# Patient Record
Sex: Female | Born: 1955 | Race: White | Hispanic: No | Marital: Single
Health system: Midwestern US, Community
[De-identification: ages and names within clinical notes are randomized; demographics above are authoritative.]

## PROBLEM LIST (undated history)

## (undated) DIAGNOSIS — J8410 Pulmonary fibrosis, unspecified: Principal | ICD-10-CM

## (undated) DIAGNOSIS — R109 Unspecified abdominal pain: Secondary | ICD-10-CM

## (undated) DIAGNOSIS — R8290 Unspecified abnormal findings in urine: Secondary | ICD-10-CM

## (undated) DIAGNOSIS — K7460 Unspecified cirrhosis of liver: Principal | ICD-10-CM

## (undated) DIAGNOSIS — J441 Chronic obstructive pulmonary disease with (acute) exacerbation: Principal | ICD-10-CM

## (undated) DIAGNOSIS — E782 Mixed hyperlipidemia: Secondary | ICD-10-CM

## (undated) DIAGNOSIS — F331 Major depressive disorder, recurrent, moderate: Principal | ICD-10-CM

## (undated) DIAGNOSIS — F5101 Primary insomnia: Secondary | ICD-10-CM

## (undated) DIAGNOSIS — J45901 Unspecified asthma with (acute) exacerbation: Secondary | ICD-10-CM

## (undated) DIAGNOSIS — J849 Interstitial pulmonary disease, unspecified: Secondary | ICD-10-CM

## (undated) DIAGNOSIS — Z0000 Encounter for general adult medical examination without abnormal findings: Secondary | ICD-10-CM

## (undated) DIAGNOSIS — R252 Cramp and spasm: Secondary | ICD-10-CM

## (undated) DIAGNOSIS — K219 Gastro-esophageal reflux disease without esophagitis: Secondary | ICD-10-CM

## (undated) DIAGNOSIS — M1990 Unspecified osteoarthritis, unspecified site: Secondary | ICD-10-CM

## (undated) DIAGNOSIS — J449 Chronic obstructive pulmonary disease, unspecified: Secondary | ICD-10-CM

## (undated) DIAGNOSIS — J45909 Unspecified asthma, uncomplicated: Secondary | ICD-10-CM

## (undated) DIAGNOSIS — I10 Essential (primary) hypertension: Secondary | ICD-10-CM

## (undated) DIAGNOSIS — E079 Disorder of thyroid, unspecified: Secondary | ICD-10-CM

## (undated) DIAGNOSIS — I509 Heart failure, unspecified: Secondary | ICD-10-CM

## (undated) HISTORY — DX: Disorder of thyroid, unspecified: E07.9

## (undated) HISTORY — PX: CYST REMOVAL NECK: SHX6281

## (undated) HISTORY — PX: CYST REMOVAL HAND: SHX6279

## (undated) HISTORY — PX: ECTOPIC PREGNANCY SURGERY: SHX613

## (undated) HISTORY — DX: Unspecified osteoarthritis, unspecified site: M19.90

## (undated) HISTORY — PX: KNEE ARTHROSCOPY: SUR90

## (undated) HISTORY — PX: OTHER SURGICAL HISTORY: SHX169

## (undated) HISTORY — DX: Gastro-esophageal reflux disease without esophagitis: K21.9

## (undated) HISTORY — PX: HAND SURGERY: SHX662

## (undated) HISTORY — PX: OVARIAN CYST SURGERY: SHX726

## (undated) HISTORY — PX: THYROIDECTOMY, PARTIAL: SHX18

## (undated) HISTORY — PX: EYE SURGERY: SHX253

## (undated) HISTORY — DX: Unspecified asthma, uncomplicated: J45.909

---

## 2001-07-16 ENCOUNTER — Encounter: Admission: RE | Admit: 2001-07-16 | Discharge: 2001-10-14 | Payer: Self-pay | Admitting: Occupational Medicine

## 2002-08-10 ENCOUNTER — Encounter: Payer: Self-pay | Admitting: Occupational Medicine

## 2002-08-10 ENCOUNTER — Encounter: Admission: RE | Admit: 2002-08-10 | Discharge: 2002-08-10 | Payer: Self-pay | Admitting: Occupational Medicine

## 2004-06-13 ENCOUNTER — Ambulatory Visit (HOSPITAL_COMMUNITY): Admission: RE | Admit: 2004-06-13 | Discharge: 2004-06-13 | Payer: Self-pay | Admitting: Internal Medicine

## 2005-04-24 ENCOUNTER — Ambulatory Visit (HOSPITAL_COMMUNITY): Payer: Self-pay | Admitting: Psychiatry

## 2005-05-09 ENCOUNTER — Ambulatory Visit (HOSPITAL_COMMUNITY): Payer: Self-pay | Admitting: Psychiatry

## 2005-05-14 ENCOUNTER — Ambulatory Visit (HOSPITAL_COMMUNITY): Admission: RE | Admit: 2005-05-14 | Discharge: 2005-05-14 | Payer: Self-pay | Admitting: Orthopedic Surgery

## 2005-05-22 ENCOUNTER — Ambulatory Visit (HOSPITAL_COMMUNITY): Payer: Self-pay | Admitting: Psychiatry

## 2005-06-05 ENCOUNTER — Ambulatory Visit (HOSPITAL_COMMUNITY): Payer: Self-pay | Admitting: Psychiatry

## 2005-06-21 ENCOUNTER — Ambulatory Visit (HOSPITAL_COMMUNITY): Payer: Self-pay | Admitting: Licensed Clinical Social Worker

## 2005-06-29 ENCOUNTER — Ambulatory Visit (HOSPITAL_COMMUNITY): Admission: RE | Admit: 2005-06-29 | Discharge: 2005-06-29 | Payer: Self-pay | Admitting: Neurosurgery

## 2005-07-24 ENCOUNTER — Ambulatory Visit (HOSPITAL_COMMUNITY): Payer: Self-pay | Admitting: Physician Assistant

## 2005-07-26 ENCOUNTER — Ambulatory Visit (HOSPITAL_COMMUNITY): Payer: Self-pay | Admitting: Licensed Clinical Social Worker

## 2005-08-01 ENCOUNTER — Ambulatory Visit (HOSPITAL_COMMUNITY): Payer: Self-pay | Admitting: Licensed Clinical Social Worker

## 2005-08-13 ENCOUNTER — Ambulatory Visit (HOSPITAL_COMMUNITY): Admission: RE | Admit: 2005-08-13 | Discharge: 2005-08-13 | Payer: Self-pay | Admitting: Family Medicine

## 2005-08-13 ENCOUNTER — Ambulatory Visit (HOSPITAL_COMMUNITY): Payer: Self-pay | Admitting: Licensed Clinical Social Worker

## 2005-08-22 ENCOUNTER — Other Ambulatory Visit: Admission: RE | Admit: 2005-08-22 | Discharge: 2005-08-22 | Payer: Self-pay | Admitting: Interventional Radiology

## 2005-08-22 ENCOUNTER — Encounter (INDEPENDENT_AMBULATORY_CARE_PROVIDER_SITE_OTHER): Payer: Self-pay | Admitting: Specialist

## 2005-08-22 ENCOUNTER — Encounter: Admission: RE | Admit: 2005-08-22 | Discharge: 2005-08-22 | Payer: Self-pay | Admitting: Family Medicine

## 2005-08-23 ENCOUNTER — Ambulatory Visit (HOSPITAL_COMMUNITY): Payer: Self-pay | Admitting: Psychiatry

## 2005-09-10 ENCOUNTER — Ambulatory Visit (HOSPITAL_COMMUNITY): Payer: Self-pay | Admitting: Licensed Clinical Social Worker

## 2005-09-17 ENCOUNTER — Ambulatory Visit (HOSPITAL_COMMUNITY): Payer: Self-pay | Admitting: Licensed Clinical Social Worker

## 2005-09-19 ENCOUNTER — Ambulatory Visit (HOSPITAL_COMMUNITY): Admission: RE | Admit: 2005-09-19 | Discharge: 2005-09-20 | Payer: Self-pay | Admitting: Surgery

## 2005-09-19 ENCOUNTER — Encounter (INDEPENDENT_AMBULATORY_CARE_PROVIDER_SITE_OTHER): Payer: Self-pay | Admitting: *Deleted

## 2005-09-24 ENCOUNTER — Ambulatory Visit (HOSPITAL_COMMUNITY): Payer: Self-pay | Admitting: Licensed Clinical Social Worker

## 2005-10-01 ENCOUNTER — Ambulatory Visit (HOSPITAL_COMMUNITY): Payer: Self-pay | Admitting: Licensed Clinical Social Worker

## 2005-10-11 ENCOUNTER — Ambulatory Visit (HOSPITAL_COMMUNITY): Payer: Self-pay | Admitting: Licensed Clinical Social Worker

## 2005-10-15 ENCOUNTER — Ambulatory Visit (HOSPITAL_COMMUNITY): Payer: Self-pay | Admitting: Psychiatry

## 2005-10-16 ENCOUNTER — Ambulatory Visit (HOSPITAL_COMMUNITY): Payer: Self-pay | Admitting: Licensed Clinical Social Worker

## 2005-10-23 ENCOUNTER — Ambulatory Visit (HOSPITAL_COMMUNITY): Payer: Self-pay | Admitting: Licensed Clinical Social Worker

## 2009-01-28 ENCOUNTER — Encounter: Admission: RE | Admit: 2009-01-28 | Discharge: 2009-01-28 | Payer: Self-pay | Admitting: Family Medicine

## 2010-07-30 ENCOUNTER — Encounter: Payer: Self-pay | Admitting: Family Medicine

## 2010-10-24 ENCOUNTER — Encounter (HOSPITAL_BASED_OUTPATIENT_CLINIC_OR_DEPARTMENT_OTHER)
Admission: RE | Admit: 2010-10-24 | Discharge: 2010-10-24 | Disposition: A | Discharge status: Home / Self Care | Source: Ambulatory Visit | Attending: Orthopedic Surgery | Admitting: Orthopedic Surgery

## 2010-10-24 LAB — BASIC METABOLIC PANEL
BUN: 12 mg/dL (ref 6–23)
CO2: 25 mEq/L (ref 19–32)
Calcium: 9.6 mg/dL (ref 8.4–10.5)
Chloride: 100 mEq/L (ref 96–112)
Creatinine, Ser: 0.89 mg/dL (ref 0.4–1.2)
GFR calc Af Amer: >60 mL/min (ref >60)
GFR calc non Af Amer: >60 mL/min (ref >60)
Glucose, Bld: 104 mg/dL — ABNORMAL HIGH (ref 70–99)
Sodium: 133 mEq/L — ABNORMAL LOW (ref 135–145)

## 2010-10-25 ENCOUNTER — Ambulatory Visit (HOSPITAL_BASED_OUTPATIENT_CLINIC_OR_DEPARTMENT_OTHER)
Admission: RE | Admit: 2010-10-25 | Discharge: 2010-10-25 | Disposition: A | Discharge status: Home / Self Care | Source: Ambulatory Visit | Attending: Orthopedic Surgery | Admitting: Orthopedic Surgery

## 2010-10-25 DIAGNOSIS — Z01812 Encounter for preprocedural laboratory examination: Secondary | ICD-10-CM | POA: Insufficient documentation

## 2010-10-25 DIAGNOSIS — I10 Essential (primary) hypertension: Secondary | ICD-10-CM | POA: Insufficient documentation

## 2010-10-25 DIAGNOSIS — G5620 Lesion of ulnar nerve, unspecified upper limb: Secondary | ICD-10-CM | POA: Insufficient documentation

## 2010-10-25 DIAGNOSIS — F411 Generalized anxiety disorder: Secondary | ICD-10-CM | POA: Insufficient documentation

## 2010-10-25 DIAGNOSIS — K219 Gastro-esophageal reflux disease without esophagitis: Secondary | ICD-10-CM | POA: Insufficient documentation

## 2010-10-25 DIAGNOSIS — E785 Hyperlipidemia, unspecified: Secondary | ICD-10-CM | POA: Insufficient documentation

## 2010-10-25 DIAGNOSIS — J4489 Other specified chronic obstructive pulmonary disease: Secondary | ICD-10-CM | POA: Insufficient documentation

## 2010-10-25 DIAGNOSIS — J449 Chronic obstructive pulmonary disease, unspecified: Secondary | ICD-10-CM | POA: Insufficient documentation

## 2010-10-25 DIAGNOSIS — F17200 Nicotine dependence, unspecified, uncomplicated: Secondary | ICD-10-CM | POA: Insufficient documentation

## 2010-10-25 DIAGNOSIS — M129 Arthropathy, unspecified: Secondary | ICD-10-CM | POA: Insufficient documentation

## 2010-10-25 DIAGNOSIS — G5600 Carpal tunnel syndrome, unspecified upper limb: Secondary | ICD-10-CM | POA: Insufficient documentation

## 2010-10-25 LAB — POCT HEMOGLOBIN-HEMACUE: Hemoglobin: 14.3 g/dL (ref 12.0–15.0)

## 2010-10-31 NOTE — Op Note (Signed)
Dawn Day, Dawn Day              ACCOUNT NO.:  [id number]  MEDICAL RECORD NO.:  [id number]          PATIENT TYPE:  LOCATION:                                 FACILITY:  PHYSICIAN:  Harvie Junior, M.D.        DATE OF BIRTH:  DATE OF PROCEDURE: DATE OF DISCHARGE:                              OPERATIVE REPORT   PREOPERATIVE DIAGNOSES:  Cubital tunnel syndrome, left and carpal tunnel syndrome, left.  POSTOPERATIVE DIAGNOSES:  Cubital tunnel syndrome, left and carpal tunnel syndrome, left.  PROCEDURE: 1. Left cubital tunnel release. 2. Left carpal tunnel release.  SURGEON:  Harvie Junior, MD  ASSISTANT:  Marshia Ly, PA.  BRIEF HISTORY:  Dawn Day is a 55 year old female with a long history of significant numbness and tingling in all of her fingers.  She was having difficulty holding things.  She was clumsy with the hand because of continued complaints of numbness and tingling and pain.  She ultimately underwent EMG examination which showed she had both carpal tunnel syndrome and cubital tunnel syndrome.  I talked to her about treatment options and at this point felt that the most reliable course of action for her was going to be decompression of the ulnar nerve at the elbow and the median nerve at the wrist.  She was brought to the operating room for this procedure.  PROCEDURE:  The patient was brought to the operating.  After adequate anesthesia was obtained with general anesthetic, the patient was supine on the operating table.  The left arm was prepped and draped in usual sterile fashion.  Following this, the arm was exsanguinated and blood pressure tourniquet inflated to 300 mmHg and following this, attention was turned towards the left arm.  After landmarks were identified, the arm had been prepped and draped and exsanguinated and blood pressure tourniquet was applied.  A small incision was made just ulnar to the midline wrist crease.  Subcutaneous tissue down  to the level of the volar carpal ligament was clearly identified and slightly divided and then a scissor was used to divide the ligament both proximally and distally.  The nerve was then identified and freed up and the once this had been released, a gloved finger was placed in the wound proximally and distally.  The wound was irrigated and closed with interrupted and running nylon suture.  Attention was then turned to the elbow where a curved incision was made over the ulnar nerve.  Subcutaneous tissue down all of the sheath over the ulnar nerve was carefully identified and then opened.  The ulnar nerve was then freed up to 10 cm above the elbow to 10 cm below the elbow and freed up at the elbow crease.  The arm was put through a range of motion.  There was no tendency towards subluxation. At this point, the wound was copiously and thoroughly irrigated.  The final check was made in the nerve to make sure it was completely freed up and in fact it was.  At this point, the wound was closed in layers.  Sterile compressive dressing was applied as well as a  posterior splint and the patient was taken to recovery room and noted to be in satisfactory condition.  Estimated blood loss for this procedure was none.     Harvie Junior, M.D.     Ranae Plumber  D:  10/25/2010  T:  10/25/2010  Job:  [id number]  Electronically Signed by Jodi Geralds M.D. on 10/31/2010 08:35:54 PM

## 2010-11-24 NOTE — Op Note (Signed)
Dawn Day, Dawn Day              ACCOUNT NO.:  [id number]   MEDICAL RECORD NO.:  [id number]          PATIENT TYPE:  OIB   LOCATION:  1008                         FACILITY:  [facility]   PHYSICIAN:  Thornton Park. Daphine Deutscher, MD  DATE OF BIRTH:  04/08/1956   DATE OF PROCEDURE:  09/19/2005  DATE OF DISCHARGE:                                 OPERATIVE REPORT   PREOPERATIVE DIAGNOSIS:  Right thyroid nodule.   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   PROCEDURE:  Right thyroid lobectomy.   SURGEON:  Dr. Luretha Murphy   ASSISTANT:  Dr. Dominga Ferry.   ANESTHESIA:  General endotracheal.   DESCRIPTION OF PROCEDURE:  Ms. Bracewell was taken to room 12 on September 19, 2005 and given general.  She was placed in the sitting position.  Her neck  was prepped with chlorhexidine and draped sterilely.  A transverse incision  was made 2 fingerbreadths above the sternal notch, roughly from  sternocleidomastoid to sternocleidomastoid until the platysma muscles were  divided, and then flaps were raised superiorly and inferiorly, and Mahorner  retractor was inserted.  The midline was identified and divided, and then  the right thyroid lobe was separated from the strap muscles.  Work brought  first the superior pole which was isolated, and these were tied with 2-0  silk ties and with clips to free the superior pole.  Inferiorly, the thyroid  was mobilized, and the middle thyroid vein was ligated.  I then stayed right  on the thyroid gland and freed it from its surroundings and teased it away  from the inferior thyroid artery.  In addition, I identified the recurrent  laryngeal nerve as it entered and stayed up on the gland away from that.  The last part of the dissection was with a sharp knife, taking it off the  region of the Berry's ligament.  Specimen was sent for frozen section with  the above-mentioned diagnosis.  In the meantime, we put in  some Surgicel.  I  inspected it for bleeding, and none was seen.  The wound was then closed in  the midline with interrupted 4-0 Vicryl and then the platysma subcutaneous  layer was with 4-0 Vicryl, and then I closed with a running subcuticular 5-0  Vicryl with Benzoin and Steri-Strips.  The patient seemed to tolerate the  procedure well and was taken to the recovery room in satisfactory condition.      Thornton Park Daphine Deutscher, MD  Electronically Signed    MBM/MEDQ  D:  09/19/2005  T:  09/20/2005  Job:  [id number]   cc:   Lesly Rubenstein, MD  [place]

## 2012-08-24 ENCOUNTER — Emergency Department (HOSPITAL_COMMUNITY)
Admission: EM | Admit: 2012-08-24 | Discharge: 2012-08-24 | Disposition: A | Discharge status: Home / Self Care | Attending: Emergency Medicine | Admitting: Emergency Medicine

## 2012-08-24 ENCOUNTER — Encounter (HOSPITAL_COMMUNITY): Payer: Self-pay

## 2012-08-24 DIAGNOSIS — I509 Heart failure, unspecified: Secondary | ICD-10-CM | POA: Insufficient documentation

## 2012-08-24 DIAGNOSIS — R42 Dizziness and giddiness: Secondary | ICD-10-CM | POA: Insufficient documentation

## 2012-08-24 DIAGNOSIS — R609 Edema, unspecified: Secondary | ICD-10-CM

## 2012-08-24 DIAGNOSIS — F17200 Nicotine dependence, unspecified, uncomplicated: Secondary | ICD-10-CM | POA: Insufficient documentation

## 2012-08-24 DIAGNOSIS — Z79899 Other long term (current) drug therapy: Secondary | ICD-10-CM | POA: Insufficient documentation

## 2012-08-24 DIAGNOSIS — I10 Essential (primary) hypertension: Secondary | ICD-10-CM | POA: Insufficient documentation

## 2012-08-24 DIAGNOSIS — J449 Chronic obstructive pulmonary disease, unspecified: Secondary | ICD-10-CM | POA: Insufficient documentation

## 2012-08-24 DIAGNOSIS — R0602 Shortness of breath: Secondary | ICD-10-CM | POA: Insufficient documentation

## 2012-08-24 DIAGNOSIS — M25569 Pain in unspecified knee: Secondary | ICD-10-CM | POA: Insufficient documentation

## 2012-08-24 DIAGNOSIS — M7989 Other specified soft tissue disorders: Secondary | ICD-10-CM

## 2012-08-24 DIAGNOSIS — J4489 Other specified chronic obstructive pulmonary disease: Secondary | ICD-10-CM | POA: Insufficient documentation

## 2012-08-24 HISTORY — DX: Chronic obstructive pulmonary disease, unspecified: J44.9

## 2012-08-24 HISTORY — DX: Heart failure, unspecified: I50.9

## 2012-08-24 HISTORY — DX: Essential (primary) hypertension: I10

## 2012-08-24 MED ORDER — ENOXAPARIN SODIUM 80 MG/0.8ML ~~LOC~~ SOLN
1.0000 mg/kg | Freq: Once | SUBCUTANEOUS | Status: AC
  Administered 2012-08-24: 65 mg via SUBCUTANEOUS
  Filled 2012-08-24: qty 0.8

## 2012-08-24 NOTE — ED Provider Notes (Addendum)
History  This chart was scribed for Donnetta Hutching, MD by Erskine Emery, ED Scribe. This patient was seen in room APA08/APA08 and the patient's care was started at 15:08.   CSN: [id number]  Arrival date & time 08/24/12  1406   First MD Initiated Contact with Patient 08/24/12 1508      Chief Complaint  Patient presents with  . Leg Swelling    (Consider location/radiation/quality/duration/timing/severity/associated sxs/prior treatment) The history is provided by the patient. No language interpreter was used.   Dawn Day is a 57 y.o. female who presents to the Emergency Department complaining of right leg swelling for the past 3 days. Pt reports some associated pain behind the right knee upon bending it at the knee, dizziness, and SOB. Pt reports she can only walk across a room before getting winded, which is baseline. Pt has a h/o congestive heart failure and COPD but no h/o blood clots in her legs. Pt saw her PCP, Dr. Loney Hering, for a similar complaint on Friday (3 days ago). He prescribed her potassium pills. She is also taking aspirin but no blood thinners. Pt was driven to the hospital by her father.  Past Medical History  Diagnosis Date  . COPD (chronic obstructive pulmonary disease)   . CHF (congestive heart failure)   . Hypertension     Past Surgical History  Procedure Laterality Date  . Abdominal hysterectomy    . Ectopic pregnancy surgery      No family history on file.  History  Substance Use Topics  . Smoking status: Current Every Day Smoker  . Smokeless tobacco: Not on file  . Alcohol Use: No    OB History   Grav Para Term Preterm Abortions TAB SAB Ect Mult Living                  Review of Systems  Respiratory: Positive for shortness of breath.   Musculoskeletal:       Right knee pain and right leg swelling.  Neurological: Positive for dizziness.  All other systems reviewed and are negative.    Allergies  Review of patient's allergies indicates not  on file.  Home Medications  No current outpatient prescriptions on file.  Triage Vitals: BP 131/80  Pulse 91  Temp(Src) 97.4 F (36.3 C) (Oral)  Resp 22  Ht 5\' 3"  (1.6 m)  Wt 146 lb (66.225 kg)  BMI 25.87 kg/m2  SpO2 96%  Physical Exam  Nursing note and vitals reviewed. Constitutional: She is oriented to person, place, and time. She appears well-developed and well-nourished.  HENT:  Head: Normocephalic and atraumatic.  Eyes: Conjunctivae and EOM are normal. Pupils are equal, round, and reactive to light.  Neck: Normal range of motion. Neck supple.  Cardiovascular: Normal rate, regular rhythm and normal heart sounds.   Pulmonary/Chest: Effort normal and breath sounds normal.  Abdominal: Soft. Bowel sounds are normal.  Musculoskeletal: Normal range of motion. She exhibits tenderness.  Right leg: tender in popliteal area. Right foot is tender.  Neurological: She is alert and oriented to person, place, and time.  Skin: Skin is warm and dry.  Psychiatric: She has a normal mood and affect.    ED Course  Procedures (including critical care time) DIAGNOSTIC STUDIES: Oxygen Saturation is 96% on room air, adequate by my interpretation.    COORDINATION OF CARE: 15:25--I evaluated the patient and we discussed a treatment plan including blood thinning medicaiton injection and follow up here tomorrow for evaluation of blood clot to  which the pt agreed.    Labs Reviewed - No data to display No results found.   No diagnosis found.    MDM  Doppler study right lower extremity scheduled for Monday morning. Lovenox 1 mg per kilogram subcutaneous given prior to discharge. Discussed potential diagnosis with patient.  No clinical evidence of pulmonary embolus.    I personally performed the services described in this documentation, which was scribed in my presence. The recorded information has been reviewed and is accurate.    Donnetta Hutching, MD 08/24/12 1627  Donnetta Hutching,  MD 08/24/12 [phone]

## 2012-08-24 NOTE — ED Notes (Signed)
Complain of feet and legs swelling

## 2012-08-25 ENCOUNTER — Ambulatory Visit (HOSPITAL_COMMUNITY): Admit: 2012-08-25

## 2013-01-29 DIAGNOSIS — I10 Essential (primary) hypertension: Secondary | ICD-10-CM | POA: Insufficient documentation

## 2014-06-21 LAB — HM HEPATITIS C SCREENING LAB: HM Hepatitis Screen: NEGATIVE

## 2014-06-24 ENCOUNTER — Ambulatory Visit: Payer: Self-pay | Admitting: Family Medicine

## 2015-12-02 LAB — HM PAP SMEAR: HM Pap smear: NEGATIVE

## 2015-12-21 ENCOUNTER — Other Ambulatory Visit: Payer: Self-pay | Admitting: Family Medicine

## 2015-12-21 DIAGNOSIS — Z1231 Encounter for screening mammogram for malignant neoplasm of breast: Secondary | ICD-10-CM

## 2016-01-11 ENCOUNTER — Ambulatory Visit
Admission: RE | Admit: 2016-01-11 | Discharge: 2016-01-11 | Disposition: A | Discharge status: Home / Self Care | Payer: BLUE CROSS/BLUE SHIELD | Source: Ambulatory Visit | Attending: Family Medicine | Admitting: Family Medicine

## 2016-01-11 ENCOUNTER — Other Ambulatory Visit: Payer: Self-pay | Admitting: Family Medicine

## 2016-01-11 DIAGNOSIS — Z1231 Encounter for screening mammogram for malignant neoplasm of breast: Secondary | ICD-10-CM

## 2016-01-11 DIAGNOSIS — R928 Other abnormal and inconclusive findings on diagnostic imaging of breast: Secondary | ICD-10-CM | POA: Diagnosis not present

## 2016-01-19 ENCOUNTER — Other Ambulatory Visit: Payer: Self-pay | Admitting: Family Medicine

## 2016-01-19 DIAGNOSIS — R928 Other abnormal and inconclusive findings on diagnostic imaging of breast: Secondary | ICD-10-CM

## 2016-01-30 ENCOUNTER — Ambulatory Visit
Admission: RE | Admit: 2016-01-30 | Discharge: 2016-01-30 | Disposition: A | Discharge status: Home / Self Care | Payer: BLUE CROSS/BLUE SHIELD | Source: Ambulatory Visit | Attending: Family Medicine | Admitting: Family Medicine

## 2016-01-30 DIAGNOSIS — R928 Other abnormal and inconclusive findings on diagnostic imaging of breast: Secondary | ICD-10-CM

## 2017-01-29 ENCOUNTER — Other Ambulatory Visit: Payer: Self-pay | Admitting: Family Medicine

## 2017-01-29 DIAGNOSIS — Z1231 Encounter for screening mammogram for malignant neoplasm of breast: Secondary | ICD-10-CM

## 2017-01-29 DIAGNOSIS — E782 Mixed hyperlipidemia: Secondary | ICD-10-CM | POA: Insufficient documentation

## 2017-02-12 ENCOUNTER — Ambulatory Visit
Admission: RE | Admit: 2017-02-12 | Discharge: 2017-02-12 | Disposition: A | Discharge status: Home / Self Care | Payer: BLUE CROSS/BLUE SHIELD | Source: Ambulatory Visit | Attending: Family Medicine | Admitting: Family Medicine

## 2017-02-12 DIAGNOSIS — Z1231 Encounter for screening mammogram for malignant neoplasm of breast: Secondary | ICD-10-CM | POA: Insufficient documentation

## 2017-08-06 LAB — HM HIV SCREENING LAB: HM HIV Screening: NEGATIVE

## 2018-02-04 ENCOUNTER — Other Ambulatory Visit: Payer: Self-pay | Admitting: Family Medicine

## 2018-02-04 DIAGNOSIS — Z1231 Encounter for screening mammogram for malignant neoplasm of breast: Secondary | ICD-10-CM

## 2018-02-21 ENCOUNTER — Ambulatory Visit
Admission: RE | Admit: 2018-02-21 | Discharge: 2018-02-21 | Disposition: A | Discharge status: Home / Self Care | Payer: BLUE CROSS/BLUE SHIELD | Source: Ambulatory Visit | Attending: Family Medicine | Admitting: Family Medicine

## 2018-02-21 DIAGNOSIS — Z1231 Encounter for screening mammogram for malignant neoplasm of breast: Secondary | ICD-10-CM | POA: Insufficient documentation

## 2018-03-04 DIAGNOSIS — H9319 Tinnitus, unspecified ear: Secondary | ICD-10-CM | POA: Insufficient documentation

## 2018-03-04 DIAGNOSIS — L723 Sebaceous cyst: Secondary | ICD-10-CM | POA: Insufficient documentation

## 2018-03-04 DIAGNOSIS — I8393 Asymptomatic varicose veins of bilateral lower extremities: Secondary | ICD-10-CM | POA: Insufficient documentation

## 2018-03-04 LAB — LIPID PANEL
Cholesterol: 162 (ref 0–200)
HDL: 59 (ref 35–70)
LDL Cholesterol: 80
Triglycerides: 114 (ref 40–160)

## 2018-03-04 LAB — BASIC METABOLIC PANEL
BUN: 18 (ref 4–21)
Creatinine: 1.1 (ref 0.5–1.1)
Glucose: 89
Potassium: 4.4 (ref 3.4–5.3)
Sodium: 135 — AB (ref 137–147)

## 2018-03-25 ENCOUNTER — Encounter (INDEPENDENT_AMBULATORY_CARE_PROVIDER_SITE_OTHER): Payer: Self-pay | Admitting: Vascular Surgery

## 2019-07-30 ENCOUNTER — Encounter: Payer: Self-pay | Admitting: Family Medicine

## 2019-07-30 ENCOUNTER — Ambulatory Visit (INDEPENDENT_AMBULATORY_CARE_PROVIDER_SITE_OTHER): Admitting: Family Medicine

## 2019-07-30 ENCOUNTER — Other Ambulatory Visit: Payer: Self-pay

## 2019-07-30 VITALS — BP 170/82 | HR 81 | Temp 98.1°F | Ht 62.0 in | Wt 147.8 lb

## 2019-07-30 DIAGNOSIS — F17200 Nicotine dependence, unspecified, uncomplicated: Secondary | ICD-10-CM

## 2019-07-30 DIAGNOSIS — J410 Simple chronic bronchitis: Secondary | ICD-10-CM

## 2019-07-30 DIAGNOSIS — Z1231 Encounter for screening mammogram for malignant neoplasm of breast: Secondary | ICD-10-CM

## 2019-07-30 DIAGNOSIS — I10 Essential (primary) hypertension: Secondary | ICD-10-CM

## 2019-07-30 DIAGNOSIS — E782 Mixed hyperlipidemia: Secondary | ICD-10-CM

## 2019-07-30 DIAGNOSIS — E039 Hypothyroidism, unspecified: Secondary | ICD-10-CM | POA: Diagnosis not present

## 2019-07-30 DIAGNOSIS — L299 Pruritus, unspecified: Secondary | ICD-10-CM

## 2019-07-30 LAB — CBC
HCT: 43.6 % (ref 36.0–46.0)
Hemoglobin: 15 g/dL (ref 12.0–15.0)
MCHC: 34.4 g/dL (ref 30.0–36.0)
MCV: 93.6 fl (ref 78.0–100.0)
Platelets: 156 10*3/uL (ref 150.0–400.0)
RBC: 4.66 Mil/uL (ref 3.87–5.11)
RDW: 13.2 % (ref 11.5–15.5)
WBC: 6.8 10*3/uL (ref 4.0–10.5)

## 2019-07-30 LAB — COMPREHENSIVE METABOLIC PANEL
ALT: 16 U/L (ref 0–35)
AST: 20 U/L (ref 0–37)
Albumin: 4.3 g/dL (ref 3.5–5.2)
Alkaline Phosphatase: 77 U/L (ref 39–117)
BUN: 10 mg/dL (ref 6–23)
CO2: 28 mEq/L (ref 19–32)
Calcium: 9.8 mg/dL (ref 8.4–10.5)
Chloride: 103 mEq/L (ref 96–112)
Creatinine, Ser: 0.87 mg/dL (ref 0.40–1.20)
GFR: 65.55 mL/min (ref >60.00)
Glucose, Bld: 97 mg/dL (ref 70–99)
Potassium: 4 mEq/L (ref 3.5–5.1)
Sodium: 137 mEq/L (ref 135–145)
Total Bilirubin: 0.7 mg/dL (ref 0.2–1.2)
Total Protein: 7.1 g/dL (ref 6.0–8.3)

## 2019-07-30 LAB — LIPID PANEL
Cholesterol: 157 mg/dL (ref 0–200)
HDL: 54.4 mg/dL (ref >39.00)
LDL Cholesterol: 82 mg/dL (ref 0–99)
NonHDL: 102.82
Total CHOL/HDL Ratio: 3
Triglycerides: 102 mg/dL (ref 0.0–149.0)
VLDL: 20.4 mg/dL (ref 0.0–40.0)

## 2019-07-30 LAB — TSH: TSH: 0.25 u[IU]/mL — ABNORMAL LOW (ref 0.35–4.50)

## 2019-07-30 MED ORDER — NICOTINE 21 MG/24HR TD PT24: 21.0000 mg | MEDICATED_PATCH | Freq: Every day | TRANSDERMAL | 1 refills | Status: DC

## 2019-07-30 MED ORDER — LEVOTHYROXINE SODIUM 88 MCG PO TABS: 88.0000 ug | ORAL_TABLET | Freq: Every day | ORAL | 3 refills | Status: DC

## 2019-07-30 MED ORDER — NICOTINE 7 MG/24HR TD PT24: 7.0000 mg | MEDICATED_PATCH | Freq: Every day | TRANSDERMAL | 0 refills | Status: AC

## 2019-07-30 MED ORDER — HYDROXYZINE HCL 25 MG PO TABS: 25.0000 mg | ORAL_TABLET | Freq: Three times a day (TID) | ORAL | 3 refills | Status: DC | PRN

## 2019-07-30 MED ORDER — ATORVASTATIN CALCIUM 10 MG PO TABS: 10.0000 mg | ORAL_TABLET | Freq: Every day | ORAL | 3 refills | Status: DC

## 2019-07-30 MED ORDER — NICOTINE 14 MG/24HR TD PT24: 14.0000 mg | MEDICATED_PATCH | Freq: Every day | TRANSDERMAL | 0 refills | Status: DC

## 2019-07-30 NOTE — Addendum Note (Signed)
Addended by: Lesleigh Noe on: 07/30/2019 05:54 PM   Modules accepted: Orders

## 2019-07-30 NOTE — Progress Notes (Signed)
Subjective:     Dawn Day is a 64 y.o. female presenting for Establish Care (previous PCP was Dr Salome Holmes), Medication Refill, and Leg Problem (wakes up at night with ache sensation in her legs)     HPI  #Asthma/COPD - occasional bad cough - still smoking - takes medication for this  - using advair daily - using rescue inhaler - using advair BID, singular - using albuterol every night and occasionally during the day  #tobacco use - has always just done a few cig/day - has tried in the past, but around people who do smoke - has had some friends try chantix and she is weary  #Leg issues - aching yesterday AM - wake her up at night with aching symptoms - feels like a muscle - not sure what it is - worse at night - no tingling or numbness - Treatment: OTC restless leg medication w/o improvement - intermittent whether they improve with walking around  Review of Systems   Social History   Tobacco Use  Smoking Status Current Every Day Smoker  . Packs/day: 0.25  . Years: 30.00  . Pack years: 7.50  . Types: Cigarettes  Smokeless Tobacco Never Used        Objective:    BP Readings from Last 3 Encounters:  07/30/19 (!) 170/82  08/24/12 98/57   Wt Readings from Last 3 Encounters:  07/30/19 147 lb 12 oz (67 kg)  08/24/12 146 lb (66.2 kg)    BP (!) 170/82   Pulse 81   Temp 98.1 F (36.7 C)   Ht 5\' 2"  (1.575 m)   Wt 147 lb 12 oz (67 kg)   SpO2 92%   BMI 27.02 kg/m    Physical Exam Constitutional:      General: She is not in acute distress.    Appearance: She is well-developed. She is not diaphoretic.  HENT:     Right Ear: External ear normal.     Left Ear: External ear normal.     Nose: Nose normal.  Eyes:     Conjunctiva/sclera: Conjunctivae normal.  Cardiovascular:     Rate and Rhythm: Normal rate and regular rhythm.     Heart sounds: No murmur.  Pulmonary:     Effort: Pulmonary effort is normal. No respiratory distress.   Breath sounds: Normal breath sounds. No wheezing.  Musculoskeletal:     Cervical back: Neck supple.     Comments: Finger clubbing  Skin:    General: Skin is warm and dry.     Capillary Refill: Capillary refill takes less than 2 seconds.  Neurological:     Mental Status: She is alert. Mental status is at baseline.  Psychiatric:        Mood and Affect: Mood normal.        Behavior: Behavior normal.           Assessment & Plan:   Problem List Items Addressed This Visit      Cardiovascular and Mediastinum   Essential hypertension - Primary    Not currently on medication. Encouraged smoking cessation. F/u 1 month for re-check. Will likely start medication if still elevated.       Relevant Medications   simvastatin (ZOCOR) 10 MG tablet   Other Relevant Orders   Comprehensive metabolic panel   CBC     Respiratory   COPD (chronic obstructive pulmonary disease) (HCC)    Pt using albuterol regularly, though overall clear lungs. Will reassess in  1 month as she is hoping to quit smoking. If still having frequent albuterol use may need to add spiriva (LAMA) to help with symptoms.       Relevant Medications   montelukast (SINGULAIR) 10 MG tablet   Fluticasone-Salmeterol (ADVAIR) 250-50 MCG/DOSE AEPB   albuterol (VENTOLIN HFA) 108 (90 Base) MCG/ACT inhaler   nicotine (NICODERM CQ - DOSED IN MG/24 HOURS) 21 mg/24hr patch   nicotine (NICODERM CQ - DOSED IN MG/24 HOURS) 14 mg/24hr patch   nicotine (NICODERM CQ - DOSED IN MG/24 HR) 7 mg/24hr patch     Endocrine   Hypothyroid   Relevant Medications   levothyroxine (SYNTHROID) 100 MCG tablet   Other Relevant Orders   TSH     Other   Hyperlipidemia, mixed   Relevant Medications   simvastatin (ZOCOR) 10 MG tablet   Other Relevant Orders   Lipid panel   Tobacco use disorder    Pt interested in quitting. Will start with nicotine replacement. Sent to pharmacy      Relevant Medications   nicotine (NICODERM CQ - DOSED IN MG/24  HOURS) 21 mg/24hr patch   nicotine (NICODERM CQ - DOSED IN MG/24 HOURS) 14 mg/24hr patch   nicotine (NICODERM CQ - DOSED IN MG/24 HR) 7 mg/24hr patch   Chronic pruritus    Controlled on hydroxyzine. Refill provided      Relevant Medications   hydrOXYzine (ATARAX/VISTARIL) 25 MG tablet       Return in about 4 weeks (around 08/27/2019) for for COPD and smoking.  Lesleigh Noe, MD

## 2019-07-30 NOTE — Assessment & Plan Note (Signed)
Controlled on hydroxyzine. Refill provided

## 2019-07-30 NOTE — Assessment & Plan Note (Signed)
Pt using albuterol regularly, though overall clear lungs. Will reassess in 1 month as she is hoping to quit smoking. If still having frequent albuterol use may need to add spiriva (LAMA) to help with symptoms.

## 2019-07-30 NOTE — Assessment & Plan Note (Signed)
Not currently on medication. Encouraged smoking cessation. F/u 1 month for re-check. Will likely start medication if still elevated.

## 2019-07-30 NOTE — Patient Instructions (Addendum)
Apply one 21-mg patch transdermally daily for weeks 1 through 6, then one 14-mg patch daily for weeks 7 and 8, and then one 7-mg patch daily for weeks 9 and 10     Leg Cramps Leg cramps occur when one or more muscles tighten and you have no control over this tightening (involuntary muscle contraction). Muscle cramps can develop in any muscle, but the most common place is in the calf muscles of the leg. Those cramps can occur during exercise or when you are at rest. Leg cramps are painful, and they may last for a few seconds to a few minutes. Cramps may return several times before they finally stop. Usually, leg cramps are not caused by a serious medical problem. In many cases, the cause is not known. Some common causes include:  Excessive physical effort (overexertion), such as during intense exercise.  Overuse from repetitive motions, or doing the same thing over and over.  Staying in a certain position for a long period of time.  Improper preparation, form, or technique while performing a sport or an activity.  Dehydration.  Injury.  Side effects of certain medicines.  Abnormally low levels of minerals in your blood (electrolytes), especially potassium and calcium. This could result from: ? Pregnancy. ? Taking diuretic medicines. Follow these instructions at home: Eating and drinking  Drink enough fluid to keep your urine pale yellow. Staying hydrated may help prevent cramps.  Eat a healthy diet that includes plenty of nutrients to help your muscles function. A healthy diet includes fruits and vegetables, lean protein, whole grains, and low-fat or nonfat dairy products. Managing pain, stiffness, and swelling      Try massaging, stretching, and relaxing the affected muscle. Do this for several minutes at a time.  If directed, put ice on areas that are sore or painful after a cramp: ? Put ice in a plastic bag. ? Place a towel between your skin and the bag. ? Leave the ice  on for 20 minutes, 2-3 times a day.  If directed, apply heat to muscles that are tense or tight. Do this before you exercise, or as often as told by your health care provider. Use the heat source that your health care provider recommends, such as a moist heat pack or a heating pad. ? Place a towel between your skin and the heat source. ? Leave the heat on for 20-30 minutes. ? Remove the heat if your skin turns bright red. This is especially important if you are unable to feel pain, heat, or cold. You may have a greater risk of getting burned.  Try taking hot showers or baths to help relax tight muscles. General instructions  If you are having frequent leg cramps, avoid intense exercise for several days.  Take over-the-counter and prescription medicines only as told by your health care provider.  Keep all follow-up visits as told by your health care provider. This is important. Contact a health care provider if:  Your leg cramps get more severe or more frequent, or they do not improve over time.  Your foot becomes cold, numb, or blue. Summary  Muscle cramps can develop in any muscle, but the most common place is in the calf muscles of the leg.  Leg cramps are painful, and they may last for a few seconds to a few minutes.  Usually, leg cramps are not caused by a serious medical problem. Often, the cause is not known.  Stay hydrated and take over-the-counter and prescription  medicines only as told by your health care provider. This information is not intended to replace advice given to you by your health care provider. Make sure you discuss any questions you have with your health care provider. Document Revised: 06/07/2017 Document Reviewed: 04/04/2017 Elsevier Patient Education  2020 [employer].

## 2019-07-30 NOTE — Assessment & Plan Note (Signed)
Pt interested in quitting. Will start with nicotine replacement. Sent to pharmacy

## 2019-08-18 ENCOUNTER — Encounter: Payer: Self-pay | Admitting: Family Medicine

## 2019-08-18 ENCOUNTER — Ambulatory Visit (INDEPENDENT_AMBULATORY_CARE_PROVIDER_SITE_OTHER): Payer: 59 | Admitting: Family Medicine

## 2019-08-18 ENCOUNTER — Other Ambulatory Visit: Payer: Self-pay

## 2019-08-18 ENCOUNTER — Telehealth: Payer: Self-pay

## 2019-08-18 VITALS — BP 168/104 | HR 73 | Temp 98.3°F | Resp 18 | Ht 62.0 in | Wt 148.0 lb

## 2019-08-18 DIAGNOSIS — R42 Dizziness and giddiness: Secondary | ICD-10-CM | POA: Diagnosis not present

## 2019-08-18 DIAGNOSIS — I10 Essential (primary) hypertension: Secondary | ICD-10-CM | POA: Diagnosis not present

## 2019-08-18 DIAGNOSIS — F17200 Nicotine dependence, unspecified, uncomplicated: Secondary | ICD-10-CM

## 2019-08-18 DIAGNOSIS — F5104 Psychophysiologic insomnia: Secondary | ICD-10-CM

## 2019-08-18 DIAGNOSIS — L299 Pruritus, unspecified: Secondary | ICD-10-CM

## 2019-08-18 DIAGNOSIS — G4700 Insomnia, unspecified: Secondary | ICD-10-CM | POA: Insufficient documentation

## 2019-08-18 MED ORDER — HYDROXYZINE HCL 25 MG PO TABS: 25.0000 mg | ORAL_TABLET | Freq: Three times a day (TID) | ORAL | 3 refills | Status: DC | PRN

## 2019-08-18 MED ORDER — LISINOPRIL 20 MG PO TABS: ORAL_TABLET | ORAL | 1 refills | Status: DC

## 2019-08-18 NOTE — Progress Notes (Signed)
Subjective:     Dawn Day is a 64 y.o. female presenting for Hypertension and Dizziness (off and on since LOV)     HPI  #HTN #Dizziness - felt bad yesterday - will get lightheaded - vision will get fuzzy - no cp - no palpitations - dizziness will occur at rest - not associated with position changes - no cough or breathing difficulty - no weakness or sensation changes - BP has been high at home, was on medication in the past but > 10 years   #Worry - Former partner just diagnosed with stage 4 liver cancer - tries to relax - is not sleeping - has had trouble sleeping for a long time, but it has been worse with stress - hx of depression - tried medication and didn't like the way it made her feel - has tried trazodone in the past - did not feel this helped   #tobacco cession - going well - on nicotine replacement  Review of Systems  Constitutional: Negative for chills and fever.     Social History   Tobacco Use  Smoking Status Current Every Day Smoker  . Packs/day: 0.25  . Years: 30.00  . Pack years: 7.50  . Types: Cigarettes  Smokeless Tobacco Never Used        Objective:    BP Readings from Last 3 Encounters:  08/18/19 (!) 168/104  07/30/19 (!) 170/82  08/24/12 98/57   Wt Readings from Last 3 Encounters:  08/18/19 148 lb (67.1 kg)  07/30/19 147 lb 12 oz (67 kg)  08/24/12 146 lb (66.2 kg)    BP (!) 168/104   Pulse 73   Temp 98.3 F (36.8 C)   Resp 18   Ht 5\' 2"  (1.575 m)   Wt 148 lb (67.1 kg)   SpO2 96%   BMI 27.07 kg/m    Physical Exam Constitutional:      General: She is not in acute distress.    Appearance: She is well-developed. She is not diaphoretic.  HENT:     Right Ear: External ear normal.     Left Ear: External ear normal.     Nose: Nose normal.  Eyes:     Conjunctiva/sclera: Conjunctivae normal.  Cardiovascular:     Rate and Rhythm: Normal rate and regular rhythm.     Heart sounds: No murmur.  Pulmonary:    Effort: Pulmonary effort is normal. No respiratory distress.     Breath sounds: Normal breath sounds. No wheezing.  Musculoskeletal:     Cervical back: Neck supple.     Right lower leg: No edema.     Left lower leg: No edema.  Skin:    General: Skin is warm and dry.     Capillary Refill: Capillary refill takes less than 2 seconds.  Neurological:     Mental Status: She is alert. Mental status is at baseline.  Psychiatric:        Mood and Affect: Mood normal.        Behavior: Behavior normal.      EKG: NSR, no ST changes, no T wave abnormalities     Assessment & Plan:   Problem List Items Addressed This Visit      Cardiovascular and Mediastinum   Essential hypertension - Primary    BP elevated. Suspect this is the cause of the dizziness. EKG reassuring. Start lisinopril. Return 1 week. If dizziness not improving will do further work-up      Relevant Medications  lisinopril (ZESTRIL) 20 MG tablet     Other   Tobacco use disorder    Working on quitting. Encouraged cessation      Chronic pruritus    Refill hydroxyzine for prn use      Relevant Medications   hydrOXYzine (ATARAX/VISTARIL) 25 MG tablet   Lightheadedness    Suspect 2/2 to HTN. Will treat and if not improving will do labs at next visit. Encouraged hydration      Relevant Orders   EKG 12-Lead (Completed)   Insomnia    Pt notes failure of trazodone in the past. Worse in setting of likely adjustment disorder due to former partner's cancer diagnosis. Melatonin and sleep hygiene. Anticipate prn ambien if no improvement          Return in about 1 week (around 08/25/2019).  Lesleigh Noe, MD

## 2019-08-18 NOTE — Assessment & Plan Note (Signed)
BP elevated. Suspect this is the cause of the dizziness. EKG reassuring. Start lisinopril. Return 1 week. If dizziness not improving will do further work-up

## 2019-08-18 NOTE — Patient Instructions (Signed)
#  Dizziness - Make sure you are drinking plenty of water - Start lisinopril  - EKG was reassuring - return in 1 week for blood pressure check  #Sleep - start taking Melatonin 3-5 mg at night 1-2 hours before bedtime.  - read through information below and see if you can make any changes that may help with sleep - return in 1 week  Sleep hygiene checklist: 1. Avoid naps during the day 2. Avoid stimulants such as caffeine and nicotine. Avoid bedtime alcohol (it can speed onset of sleep but the body's metabolism can cause awakenings). At least 2 hours before bedtime 3. All forms of exercise help ensure sound sleep - limit vigorous exercise to morning or late afternoon 4. Avoid food too close to bedtime including chocolate (which contains caffeine) 5. Soak up natural light 6. Establish regular bedtime routine. 7. Associate bed with sleep - avoid TV, computer or phone, reading while in bed. 8. Ensure pleasant, relaxing sleep environment - quiet, dark, cool room.  Good Sleep Hygiene Habits -- Got to bed and wake up within an hour of the same time every day -- Avoid bright screens (from laptop, phone, TV) within at least 30 minutes before bed. The "blue light" supresses the sleep hormone melatonin and the content may stimulate as well -- Maintain a quiet and dark sleep environment (blackout curtains, turn on a fan or white noise to block out disruptive sounds) -- Practicing relaxing activites before bed (taking a shower, reading a book, journaling, meditation app) -- To quiet a busy mind -- consider journaling before bed (jotting down reminders, worry thoughts, as well as positive things like a gratitude list)   Begin a Mindfulness/Meditation practice -- this can take a little as 3 minutes -- You can find resources in books -- Or you can download apps like  ---- Headspace App (which currently has free content called "Weathering the Storm") ---- Calm (which has a few free options)  ----  Insignt Timer ---- Stop, Breathe & Think  # With each of these Apps - you should decline the "start free trial" offer and as you search through the App should be able to access some of their free content. You can also chose to pay for the content if you find one that works well for you.   # Many of them also offer sleep specific content which may help with insomnia

## 2019-08-18 NOTE — Telephone Encounter (Signed)
Pt last seen 07/30/19; pt was to cb if BP was elevated. Pt said last night BP 140 something /99 and pt had lightheadedness on and off yesterday;advised pt  To stand up slowly. o lightheadedness today. Pt has been under a lot of stress. Now BP 139/94 P 73. Pt already has appt on 08/27/19 but pt does not want to wait for that appt due to elevated BP.no covid symptoms except has usual chronic cough; pt has asthma and had same cough when seen 07/30/19. Pt said is working on stop smoking but has not stopped yet. Left 08/27/19 appt incase pt needs to return for FU. If pt does not need to keep that appt pt will cancel on her way out of office. FYI to Dr Einar Pheasant.

## 2019-08-18 NOTE — Assessment & Plan Note (Signed)
Suspect 2/2 to HTN. Will treat and if not improving will do labs at next visit. Encouraged hydration

## 2019-08-18 NOTE — Assessment & Plan Note (Signed)
Refill hydroxyzine for prn use

## 2019-08-18 NOTE — Telephone Encounter (Signed)
See note from today

## 2019-08-18 NOTE — Assessment & Plan Note (Signed)
Pt notes failure of trazodone in the past. Worse in setting of likely adjustment disorder due to former partner's cancer diagnosis. Melatonin and sleep hygiene. Anticipate prn ambien if no improvement

## 2019-08-18 NOTE — Assessment & Plan Note (Signed)
Working on quitting. Encouraged cessation

## 2019-08-26 ENCOUNTER — Other Ambulatory Visit: Payer: Self-pay

## 2019-08-26 ENCOUNTER — Ambulatory Visit (INDEPENDENT_AMBULATORY_CARE_PROVIDER_SITE_OTHER): Payer: 59 | Admitting: Family Medicine

## 2019-08-26 VITALS — BP 128/82 | HR 64 | Temp 97.9°F | Resp 18 | Ht 62.0 in | Wt 150.5 lb

## 2019-08-26 DIAGNOSIS — J42 Unspecified chronic bronchitis: Secondary | ICD-10-CM

## 2019-08-26 DIAGNOSIS — F17200 Nicotine dependence, unspecified, uncomplicated: Secondary | ICD-10-CM

## 2019-08-26 DIAGNOSIS — I10 Essential (primary) hypertension: Secondary | ICD-10-CM | POA: Diagnosis not present

## 2019-08-26 DIAGNOSIS — D171 Benign lipomatous neoplasm of skin and subcutaneous tissue of trunk: Secondary | ICD-10-CM | POA: Insufficient documentation

## 2019-08-26 MED ORDER — LOSARTAN POTASSIUM 50 MG PO TABS: 50.0000 mg | ORAL_TABLET | Freq: Every day | ORAL | 3 refills | Status: DC

## 2019-08-26 MED ORDER — TIOTROPIUM BROMIDE MONOHYDRATE 18 MCG IN CAPS: 18.0000 ug | ORAL_CAPSULE | Freq: Every day | RESPIRATORY_TRACT | 12 refills | Status: DC

## 2019-08-26 NOTE — Patient Instructions (Signed)
#   High Blood Pressure - Stop the Lisinopril - Start Losartan 50 mg - Check blood pressure at home - If >140/90, call the clinic and we can plan to increase medication  #COPD - start Spiriva - Continue Advair - Return in 3-4 weeks  When you come back in 3-4 weeks -- Labs for dizziness and thyroid -- Breathing check

## 2019-08-26 NOTE — Assessment & Plan Note (Signed)
BP improved on lisinopril but with dry cough. Will switch to losartan.

## 2019-08-26 NOTE — Progress Notes (Signed)
Subjective:     Dawn Day is a 64 y.o. female presenting for Hypertension (follow up) and Dizziness (follow up)     HPI   #HTN - bp improved - still getting some lightheadedness for brief moments while standing - has noticed more coughing - running good at home  #COPD - working on quitting smoking - down to less than 1/2 ppd  - has noticed worsening cough - when she doesn't smoke her BP does  - has noticed a dry cough - using the advair - using albuterol 2-3 times a day   Review of Systems  Respiratory: Negative for shortness of breath.   Cardiovascular: Negative for chest pain.    08/18/2019: Clinic - HTN and lightheadedness - Lisinopril for HTN.   Social History   Tobacco Use  Smoking Status Current Every Day Smoker  . Packs/day: 0.25  . Years: 30.00  . Pack years: 7.50  . Types: Cigarettes  Smokeless Tobacco Never Used        Objective:    BP Readings from Last 3 Encounters:  08/26/19 128/82  08/18/19 (!) 168/104  07/30/19 (!) 170/82   Wt Readings from Last 3 Encounters:  08/26/19 150 lb 8 oz (68.3 kg)  08/18/19 148 lb (67.1 kg)  07/30/19 147 lb 12 oz (67 kg)    BP 128/82   Pulse 64   Temp 97.9 F (36.6 C)   Resp 18   Ht 5\' 2"  (1.575 m)   Wt 150 lb 8 oz (68.3 kg)   SpO2 94%   BMI 27.53 kg/m    Physical Exam Constitutional:      General: She is not in acute distress.    Appearance: She is well-developed. She is not diaphoretic.  HENT:     Right Ear: External ear normal.     Left Ear: External ear normal.     Nose: Nose normal.  Eyes:     Conjunctiva/sclera: Conjunctivae normal.  Cardiovascular:     Rate and Rhythm: Normal rate and regular rhythm.     Heart sounds: No murmur.  Pulmonary:     Effort: Pulmonary effort is normal. No respiratory distress.     Breath sounds: Wheezing present. No rales.  Musculoskeletal:     Cervical back: Neck supple.  Skin:    General: Skin is warm and dry.     Capillary Refill: Capillary  refill takes less than 2 seconds.     Comments: Egg sized lipoma on the left shoulder blade area  Neurological:     Mental Status: She is alert. Mental status is at baseline.  Psychiatric:        Mood and Affect: Mood normal.        Behavior: Behavior normal.           Assessment & Plan:   Problem List Items Addressed This Visit      Cardiovascular and Mediastinum   Essential hypertension    BP improved on lisinopril but with dry cough. Will switch to losartan.       Relevant Medications   losartan (COZAAR) 50 MG tablet     Respiratory   COPD (chronic obstructive pulmonary disease) (HCC) - Primary    Still using albuterol regularly and lungs with diffuse wheezes and generally poor air movement. Will add spiriva. If not improvement at f/u will refer to pulmonology      Relevant Medications   tiotropium (SPIRIVA) 18 MCG inhalation capsule     Other  Tobacco use disorder    Encouraged her continued efforts to cut back.       Lipoma of back   Relevant Orders   Ambulatory referral to General Surgery       Return in about 4 weeks (around 09/23/2019).  Lesleigh Noe, MD

## 2019-08-26 NOTE — Assessment & Plan Note (Signed)
Still using albuterol regularly and lungs with diffuse wheezes and generally poor air movement. Will add spiriva. If not improvement at f/u will refer to pulmonology

## 2019-08-26 NOTE — Assessment & Plan Note (Signed)
Encouraged her continued efforts to cut back.

## 2019-08-27 ENCOUNTER — Ambulatory Visit: Admitting: Family Medicine

## 2019-09-04 ENCOUNTER — Ambulatory Visit: Payer: Self-pay | Admitting: Surgery

## 2019-09-07 ENCOUNTER — Ambulatory Visit (INDEPENDENT_AMBULATORY_CARE_PROVIDER_SITE_OTHER): Payer: 59 | Admitting: Surgery

## 2019-09-07 ENCOUNTER — Other Ambulatory Visit: Payer: Self-pay

## 2019-09-07 ENCOUNTER — Encounter: Payer: Self-pay | Admitting: Surgery

## 2019-09-07 VITALS — BP 110/72 | HR 96 | Temp 97.5°F | Resp 12 | Ht 62.0 in | Wt 149.0 lb

## 2019-09-07 DIAGNOSIS — D171 Benign lipomatous neoplasm of skin and subcutaneous tissue of trunk: Secondary | ICD-10-CM | POA: Diagnosis not present

## 2019-09-07 NOTE — Patient Instructions (Addendum)
As discussed with Dr Hampton Abbot please talk to your daughter in regards to when you want to schedule your appointment to have your lipoma removed here at the office. As stated, Dr Mont Dutton office days are Mondays afternoon, Wednesday mornings and Fridays mornings. Once speaking to your daughter, please contact the office to have the procedure scheduled.  Please call the office if you have any questions or concerns.  Lipoma Removal  Lipoma removal is a surgical procedure to remove a lipoma, which is a noncancerous (benign) tumor that is made up of fat cells. Most lipomas are small and painless and do not require treatment. They can form in many areas of the body but are most common under the skin of the back, arms, shoulders, buttocks, and thighs. You may need lipoma removal if you have a lipoma that is large, growing, or causing discomfort. Lipoma removal may also be done for cosmetic reasons. Tell a health care provider about:  Any allergies you have.  All medicines you are taking, including vitamins, herbs, eye drops, creams, and over-the-counter medicines.  Any problems you or family members have had with anesthetic medicines.  Any blood disorders you have.  Any surgeries you have had.  Any medical conditions you have.  Whether you are pregnant or may be pregnant. What are the risks? Generally, this is a safe procedure. However, problems may occur, including:  Infection.  Bleeding.  Scarring.  Allergic reactions to medicines.  Damage to nearby structures or organs, such as damage to nerves or blood vessels near the lipoma. What happens before the procedure? Staying hydrated Follow instructions from your health care provider about hydration, which may include:  Up to 2 hours before the procedure - you may continue to drink clear liquids, such as water, clear fruit juice, black coffee, and plain tea. Eating and drinking restrictions Follow instructions from your health care  provider about eating and drinking, which may include:  8 hours before the procedure - stop eating heavy meals or foods, such as meat, fried foods, or fatty foods.  6 hours before the procedure - stop eating light meals or foods, such as toast or cereal.  6 hours before the procedure - stop drinking milk or drinks that contain milk.  2 hours before the procedure - stop drinking clear liquids. Medicines Ask your health care provider about:  Changing or stopping your regular medicines. This is especially important if you are taking diabetes medicines or blood thinners.  Taking medicines such as aspirin and ibuprofen. These medicines can thin your blood. Do not take these medicines unless your health care provider tells you to take them.  Taking over-the-counter medicines, vitamins, herbs, and supplements. General instructions  You will have a physical exam. Your health care provider will check the size of the lipoma and whether it can be moved easily.  You may have a biopsy and imaging tests, such as X-rays, a CT scan, and an MRI.  Do not use any products that contain nicotine or tobacco for at least 4 weeks before the procedure. These products include cigarettes, e-cigarettes, and chewing tobacco. If you need help quitting, ask your health care provider.  Ask your health care provider: ? How your surgery site will be marked. ? What steps will be taken to help prevent infection. These may include:  Washing skin with a germ-killing soap.  Taking antibiotic medicine.  Plan to have someone take you home from the hospital or clinic.  If you will be going home  right after the procedure, plan to have someone with you for 24 hours. What happens during the procedure?   An IV will be inserted into one of your veins.  You will be given one or more of the following: ? A medicine to help you relax (sedative). ? A medicine to numb the area (local anesthetic). ? A medicine to make you  fall asleep (general anesthetic). ? A medicine that is injected into an area of your body to numb everything below the injection site (regional anesthetic).  An incision will be made over the lipoma or very near the lipoma. The incision may be made in a natural skin line or crease.  Tissues, nerves, and blood vessels near the lipoma will be moved out of the way.  The lipoma and the capsule that surrounds it will be separated from the surrounding tissues.  The lipoma will be removed.  The incision may be closed with stitches (sutures).  A bandage (dressing) will be placed over the incision. The procedure may vary among health care providers and hospitals. What happens after the procedure?  Your blood pressure, heart rate, breathing rate, and blood oxygen level will be monitored until you leave the hospital or clinic.  If you were prescribed an antibiotic medicine, use it as told by your health care provider. Do not stop using the antibiotic even if you start to feel better.  If you were given a sedative during the procedure, it can affect you for several hours. Do not drive or operate machinery until your health care provider says that it is safe. Summary  Before the procedure, follow instructions from your health care provider about eating and drinking, and changing or stopping your regular medicines. This is especially important if you are taking diabetes medicines or blood thinners.  After the lipoma is removed, the incision may be closed with stitches (sutures) and covered with a bandage (dressing).  If you were given a sedative during the procedure, it can affect you for several hours. Do not drive or operate machinery until your health care provider says that it is safe. This information is not intended to replace advice given to you by your health care provider. Make sure you discuss any questions you have with your health care provider. Document Revised: 02/09/2019 Document  Reviewed: 02/09/2019 Elsevier Patient Education  [place].

## 2019-09-08 ENCOUNTER — Encounter: Payer: Self-pay | Admitting: Surgery

## 2019-09-08 NOTE — Progress Notes (Signed)
09/08/2019  Reason for Visit:  Back lipoma  History of Present Illness: Dawn Day is a 64 y.o. female presenting for evaluation of a left upper back lipoma.  She reports she has had this for many years.  It started very small and has grown in size.  She denies any pain from the mass, but she can feel it when she lies down or sits back.  She does have a history of prior excision of skin lesions, and does not want this mass to get worse.  Denies any drainage, pain, erythema from that area.  Past Medical History: Past Medical History:  Diagnosis Date  . Arthritis   . Asthma   . COPD (chronic obstructive pulmonary disease) ([place])   . GERD (gastroesophageal reflux disease)   . Hypertension   . Thyroid disease      Past Surgical History: Past Surgical History:  Procedure Laterality Date  . Arm surgery Left    for pinched nerve  . CYST REMOVAL HAND Right   . CYST REMOVAL NECK Left   . 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 HAND SURGERY Bilateral    to re route nerves for hand weakness  . KNEE ARTHROSCOPY Right   . OVARIAN CYST SURGERY     removed cyst and tube-not sure of the side  . THYROIDECTOMY, PARTIAL Left     Home Medications: Prior to Admission medications   Medication Sig Start Date End Date Taking? Authorizing Provider  albuterol (VENTOLIN HFA) 108 (90 Base) MCG/ACT inhaler Inhale 1 puff into the lungs every 6 (six) hours as needed. 06/02/19 06/01/20 Yes [provider]  atorvastatin (LIPITOR) 10 MG tablet Take 1 tablet (10 mg total) by mouth daily. 07/30/19  Yes Lesleigh Noe, MD  Fluticasone-Salmeterol (ADVAIR) 250-50 MCG/DOSE AEPB Inhale 1 puff into the lungs every 12 (twelve) hours. 07/11/19  Yes [provider]  hydrOXYzine (ATARAX/VISTARIL) 25 MG tablet Take 1 tablet (25 mg total) by mouth 3 (three) times daily as needed. For itching 08/18/19 06/08/20 Yes Lesleigh Noe, MD  levothyroxine (SYNTHROID) 88 MCG tablet Take 1 tablet (88 mcg  total) by mouth daily. 07/30/19  Yes Lesleigh Noe, MD  losartan (COZAAR) 50 MG tablet Take 1 tablet (50 mg total) by mouth daily. 08/26/19  Yes Lesleigh Noe, MD  montelukast (SINGULAIR) 10 MG tablet Take 10 mg by mouth daily. 05/25/19  Yes [provider]  nicotine (NICODERM CQ - DOSED IN MG/24 HOURS) 14 mg/24hr patch Place 1 patch (14 mg total) onto the skin daily. 07/30/19  Yes Lesleigh Noe, MD  nicotine (NICODERM CQ - DOSED IN MG/24 HOURS) 21 mg/24hr patch Place 1 patch (21 mg total) onto the skin daily. 07/30/19  Yes Lesleigh Noe, MD  nicotine (NICODERM CQ - DOSED IN MG/24 HR) 7 mg/24hr patch Place 1 patch (7 mg total) onto the skin daily. 07/30/19  Yes Lesleigh Noe, MD  tiotropium (SPIRIVA) 18 MCG inhalation capsule Place 1 capsule (18 mcg total) into inhaler and inhale daily. 08/26/19  Yes Lesleigh Noe, MD    Allergies: Allergies  Allergen Reactions  . Pravastatin Other (See Comments)    Muscle pain    Social History:  reports that she has been smoking cigarettes. She has a 7.50 pack-year smoking history. She has never used smokeless tobacco. She reports current alcohol use. She reports that she does not use drugs.   Family History: Family History  Problem Relation  Age of Onset  . Breast cancer Maternal Aunt   . Alcohol abuse Mother   . Early death Mother        appendix rupture  . Depression Mother   . Stomach cancer Brother   . Alcohol abuse Brother   . Drug abuse Brother     Review of Systems: Review of Systems  Constitutional: Negative for chills and fever.  HENT: Negative for hearing loss.   Respiratory: Negative for shortness of breath.   Cardiovascular: Negative for chest pain.  Gastrointestinal: Negative for abdominal pain, nausea and vomiting.  Genitourinary: Negative for dysuria.  Musculoskeletal: Negative for myalgias.  Skin: Negative for rash.  Neurological: Negative for dizziness.  Psychiatric/Behavioral: Negative for depression.     Physical Exam BP 110/72   Pulse 96   Temp (!) 97.5 F (36.4 C)   Resp 12   Ht 5\' 2"  (1.575 m)   Wt 149 lb (67.6 kg)   SpO2 97%   BMI 27.25 kg/m  CONSTITUTIONAL: No acute distress HEENT:  Normocephalic, atraumatic, extraocular motion intact. NECK: Trachea is midline, and there is no jugular venous distension.  RESPIRATORY:  Lungs are clear, and breath sounds are equal bilaterally. Normal respiratory effort without pathologic use of accessory muscles. CARDIOVASCULAR: Heart is regular without murmurs, gallops, or rubs. GI: The abdomen is soft, non-distended, non-tender.  MUSCULOSKELETAL:  Normal muscle strength and tone in all four extremities.  No peripheral edema or cyanosis. SKIN:  Left upper back has a 5 cm, mobile, soft mass consistent with lipoma.   It is not tender.  There is no overlying skin ulceration.  The mass is overlying the medial aspect of the left scapula. NEUROLOGIC:  Motor and sensation is grossly normal.  Cranial nerves are grossly intact. PSYCH:  Alert and oriented to person, place and time. Affect is normal.  Laboratory Analysis: Labs 07/30/19: WBC 6.8, Hgb 15, Hct 43.6, Plt 156.  Na 137, K 4.0, Cl 103, CO2 28, BUN 10, Cr. 0.87, LFTs within normal.  Imaging: No results found.  Assessment and Plan: This is a 64 y.o. female with a left upper back lipoma.  --Discussed with the patient the potential options for excision to be done in the office or in the OR.  After discussing the pros and cons of each, she has opted for excision in the office.  I think that is very reasonable based on the size and the location.  It is very mobile and non-tender. --The patient will be talking with her family this weekend to discuss when it would be best to do the procedure.  She will call us early next week to schedule her procedure.  Discussed with her that my clinic days are Monday afternoons, and Wednesday and Friday mornings. --Follow up depending on timing for the  procedure.  Face-to-face time spent with the patient and care providers was 60 minutes, with more than 50% of the time spent counseling, educating, and coordinating care of the patient.     Melvyn Neth, [facility]

## 2019-09-21 ENCOUNTER — Ambulatory Visit (INDEPENDENT_AMBULATORY_CARE_PROVIDER_SITE_OTHER): Payer: 59 | Admitting: Family Medicine

## 2019-09-21 ENCOUNTER — Encounter: Payer: Self-pay | Admitting: Family Medicine

## 2019-09-21 ENCOUNTER — Other Ambulatory Visit: Payer: Self-pay

## 2019-09-21 VITALS — BP 124/72 | HR 72 | Temp 98.0°F | Resp 12 | Ht 62.0 in | Wt 151.2 lb

## 2019-09-21 DIAGNOSIS — E038 Other specified hypothyroidism: Secondary | ICD-10-CM | POA: Diagnosis not present

## 2019-09-21 DIAGNOSIS — I10 Essential (primary) hypertension: Secondary | ICD-10-CM

## 2019-09-21 DIAGNOSIS — J449 Chronic obstructive pulmonary disease, unspecified: Secondary | ICD-10-CM

## 2019-09-21 DIAGNOSIS — R1011 Right upper quadrant pain: Secondary | ICD-10-CM | POA: Diagnosis not present

## 2019-09-21 DIAGNOSIS — R1012 Left upper quadrant pain: Secondary | ICD-10-CM

## 2019-09-21 LAB — BASIC METABOLIC PANEL
BUN: 8 mg/dL (ref 6–23)
CO2: 27 mEq/L (ref 19–32)
Calcium: 9.8 mg/dL (ref 8.4–10.5)
Chloride: 99 mEq/L (ref 96–112)
Creatinine, Ser: 0.86 mg/dL (ref 0.40–1.20)
GFR: 66.4 mL/min (ref >60.00)
Glucose, Bld: 101 mg/dL — ABNORMAL HIGH (ref 70–99)
Potassium: 4.2 mEq/L (ref 3.5–5.1)
Sodium: 136 mEq/L (ref 135–145)

## 2019-09-21 LAB — TSH: TSH: 1.73 u[IU]/mL (ref 0.35–4.50)

## 2019-09-21 NOTE — Assessment & Plan Note (Signed)
Dose was increased as over-replaced in January. Repeat TSH today. Cont current dose.

## 2019-09-21 NOTE — Assessment & Plan Note (Signed)
BP well controled on losartan and no longer with cough. Cont losartan. Labs today

## 2019-09-21 NOTE — Assessment & Plan Note (Signed)
Pt notes improvement with needing albuterol less. Still with daily use, but previous was 2-3 times per day. Will continue to monitor and get Pulm if worsening.

## 2019-09-21 NOTE — Assessment & Plan Note (Signed)
Suspect muscle pain. Advised slow stretching to see if that improves.

## 2019-09-21 NOTE — Patient Instructions (Addendum)
#  High blood pressure - continue losartan - labs today  #COPD - if you end up needing more albuterol, let me know - otherwise continue current medications   #Abdominal wall pain - Try to stretch your abdomen - I couldn't find good examples - bend/twist to the side gently - bend back, but don't hurt your back

## 2019-09-21 NOTE — Progress Notes (Signed)
Subjective:     Dawn Day is a 64 y.o. female presenting for Follow-up (on COPD HTN)     HPI  #HTN - doing well - no longer has cough - no longer lightheadedness - bp at goal - no cp  #COPD - breathing better - is on Spiriva - using albuterol 1-2 times per day - still working on quitting smoking - doing better - less than 1/2 ppd  - using nicotine replacement  #hypothyroidism - TSH was high in Jan - taking lower dose  #abdominal pain - will get a "catch" and knot in her abdomen  - will happen with bending over or turning - feels like a muscle - will be either under the right or left breast at the rib - non-radiating - no constipation  Review of Systems  08/26/2019: Clinic - HTN - switched to losartan 2/2 to cough with lisinopril. COPD - add spiriva   Social History   Tobacco Use  Smoking Status Current Every Day Smoker  . Packs/day: 0.25  . Years: 30.00  . Pack years: 7.50  . Types: Cigarettes  Smokeless Tobacco Never Used        Objective:    BP Readings from Last 3 Encounters:  09/21/19 124/72  09/07/19 110/72  08/26/19 128/82   Wt Readings from Last 3 Encounters:  09/21/19 151 lb 4 oz (68.6 kg)  09/07/19 149 lb (67.6 kg)  08/26/19 150 lb 8 oz (68.3 kg)    BP 124/72   Pulse 72   Temp 98 F (36.7 C)   Resp 12   Ht 5\' 2"  (1.575 m)   Wt 151 lb 4 oz (68.6 kg)   SpO2 95%   BMI 27.66 kg/m    Physical Exam Constitutional:      General: She is not in acute distress.    Appearance: She is well-developed. She is not diaphoretic.  HENT:     Right Ear: External ear normal.     Left Ear: External ear normal.     Nose: Nose normal.  Eyes:     Conjunctiva/sclera: Conjunctivae normal.  Cardiovascular:     Rate and Rhythm: Normal rate and regular rhythm.     Heart sounds: No murmur.  Pulmonary:     Effort: Pulmonary effort is normal. No respiratory distress.     Breath sounds: Normal breath sounds. No wheezing.  Abdominal:   General: Abdomen is flat. Bowel sounds are normal. There is no distension.     Palpations: Abdomen is soft.     Tenderness: There is no abdominal tenderness. There is no guarding or rebound.  Musculoskeletal:     Cervical back: Neck supple.  Skin:    General: Skin is warm and dry.     Capillary Refill: Capillary refill takes less than 2 seconds.  Neurological:     Mental Status: She is alert. Mental status is at baseline.  Psychiatric:        Mood and Affect: Mood normal.        Behavior: Behavior normal.           Assessment & Plan:   Problem List Items Addressed This Visit      Cardiovascular and Mediastinum   Essential hypertension - Primary    BP well controled on losartan and no longer with cough. Cont losartan. Labs today      Relevant Orders   Basic metabolic panel     Respiratory   COPD (chronic obstructive pulmonary disease) (  [place])    Pt notes improvement with needing albuterol less. Still with daily use, but previous was 2-3 times per day. Will continue to monitor and get Pulm if worsening.         Endocrine   Hypothyroid    Dose was increased as over-replaced in January. Repeat TSH today. Cont current dose.       Relevant Orders   TSH     Other   Abdominal wall pain in both upper quadrants    Suspect muscle pain. Advised slow stretching to see if that improves.           Return in about 3 months (around 12/22/2019) for annual.  Lesleigh Noe, MD

## 2019-11-04 ENCOUNTER — Other Ambulatory Visit: Payer: Self-pay | Admitting: Family Medicine

## 2019-11-04 DIAGNOSIS — L299 Pruritus, unspecified: Secondary | ICD-10-CM

## 2019-11-04 MED ORDER — HYDROXYZINE HCL 25 MG PO TABS: 25.0000 mg | ORAL_TABLET | Freq: Three times a day (TID) | ORAL | 1 refills | Status: DC | PRN

## 2019-11-04 NOTE — Telephone Encounter (Signed)
I called [facility] and was advised that there were no more refills on file. 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 States her skin is so dry and therefore gives her a lot of itching. This medication helps. Patient states also that her other prescriptions are for 90 days and wonders if this one can be also.  LOV 09/21/19 and next appointment on 12/24/19 for follow up. On 08/18/19 was the last time chronic pruritis was addressed.  Last time Hydroxyzine was filled was on 08/18/19 #30 with 3 refills with directions of 1 tablet 3 times daily as needed. Does direction need to be updated?

## 2019-11-04 NOTE — Telephone Encounter (Signed)
Patient stated she contacted her pharmacy for a refill on her hydrOXYzine  On her bottle it states she still has a refill left but when speaking to the pharmacy they stated she no longer has refills. Patient stated she has been having trouble getting refills from the [facility] so they did not say if they would be sending refill request. Patient would like to know if she still has refills if not can some be sent to the pharmacy for her      She would like the refill sent to [facility]

## 2019-11-23 ENCOUNTER — Ambulatory Visit
Admission: RE | Admit: 2019-11-23 | Discharge: 2019-11-23 | Disposition: A | Discharge status: Home / Self Care | Payer: 59 | Source: Ambulatory Visit | Attending: Family Medicine | Admitting: Family Medicine

## 2019-11-23 DIAGNOSIS — Z1231 Encounter for screening mammogram for malignant neoplasm of breast: Secondary | ICD-10-CM | POA: Insufficient documentation

## 2019-12-24 ENCOUNTER — Other Ambulatory Visit (HOSPITAL_COMMUNITY)
Admission: RE | Admit: 2019-12-24 | Discharge: 2019-12-24 | Disposition: A | Discharge status: Home / Self Care | Payer: 59 | Source: Ambulatory Visit | Attending: Family Medicine | Admitting: Family Medicine

## 2019-12-24 ENCOUNTER — Other Ambulatory Visit: Payer: Self-pay

## 2019-12-24 ENCOUNTER — Ambulatory Visit (INDEPENDENT_AMBULATORY_CARE_PROVIDER_SITE_OTHER): Payer: 59 | Admitting: Family Medicine

## 2019-12-24 ENCOUNTER — Encounter: Payer: Self-pay | Admitting: Family Medicine

## 2019-12-24 VITALS — BP 124/82 | HR 74 | Temp 98.3°F | Ht 62.0 in | Wt 147.0 lb

## 2019-12-24 DIAGNOSIS — Z124 Encounter for screening for malignant neoplasm of cervix: Secondary | ICD-10-CM | POA: Diagnosis present

## 2019-12-24 DIAGNOSIS — Z1211 Encounter for screening for malignant neoplasm of colon: Secondary | ICD-10-CM | POA: Diagnosis not present

## 2019-12-24 DIAGNOSIS — Z0000 Encounter for general adult medical examination without abnormal findings: Secondary | ICD-10-CM | POA: Diagnosis not present

## 2019-12-24 NOTE — Progress Notes (Signed)
Annual Exam   Chief Complaint:  Chief Complaint  Patient presents with  . Annual Exam    History of Present Illness:  Ms. Dawn Day is a 64 y.o. No obstetric history on file. who LMP was No LMP recorded. Patient is postmenopausal., presents today for her annual examination.    #Fall - left shoulder pain - was stepping up to clean the curtains and fell on her shoulder - fall was 1 month ago - is improving  - still with some pain if she puts pressure on that side at night - rolling to that side - pain with lifting  Recent death in the family Daughters father passed away - he was hospitalized for 3 weeks prior to his death by suicide but was diagnosed with liver cancer  Nutrition She does get adequate calcium and Vitamin D in her diet. Diet: baked or boiled, no fried food, only occasional eats out. Decreased appetite over the last month Exercise: not currently  Safety The patient wears seatbelts: yes.     The patient feels safe at home and in their relationships: yes.   Menstrual Post menopausal, still with occasional hot flashes  GYN She is not sexually active.   Cervical Cancer Screening:   Last Pap:   October 2014 Results were: no abnormalities /neg HPV DNA not done  Breast Cancer Screening There is no FH of breast cancer. There is no FH of ovarian cancer. BRCA screening Not Indicated.  Last Mammogram: 11/2019 The patient does want a mammogram this year.    Colon Cancer Screening Age 2-75 yo - benefits outweigh the risk. Adults 28-85 yo who have never been screened benefit.  Benefits: 134000 people in 2016 will be diagnosed and 49,000 will die - early detection helps [place]: Complications 2/2 to colonoscopy High Risk (Colonoscopy): genetic disorder (Lynch syndrome or familial adenomatous polyposis), personal hx of IBD, previous adenomatous polyp, or previous colorectal cancer, FamHx start 10 years before the age at diagnosis, increased in males and black  race  Options:  FIT - looks for hemoglobin (blood in the stool) - specific and fairly sensitive - must be done annually Cologuard - looks for DNA and blood - more sensitive - therefore can have more false positives, every 3 years Colonoscopy - every 10 years if normal - sedation, bowl prep, must have someone drive you  Shared decision making and the patient had decided to do FOBT.   Lung Cancer Screening Annual screening for adults age 52-80 yo with 30 year pack history? No Current Tobacco user? Yes Quit less than 15 years ago? No Interested in low dose CT for lung cancer screening? not applicable  Still working on quitting smoking  Weight Wt Readings from Last 3 Encounters:  12/24/19 147 lb (66.7 kg)  09/21/19 151 lb 4 oz (68.6 kg)  09/07/19 149 lb (67.6 kg)   Patient has high BMI  BMI Readings from Last 1 Encounters:  12/24/19 26.89 kg/m     Chronic disease screening Blood pressure monitoring:  BP Readings from Last 3 Encounters:  12/24/19 124/82  09/21/19 124/72  09/07/19 110/72    Lipid Monitoring: Indication for screening: age >67, obesity, diabetes, family hx, CV risk factors.  Lipid screening: Yes  Lab Results  Component Value Date   CHOL 157 07/30/2019   HDL 54.40 07/30/2019   LDLCALC 82 07/30/2019   TRIG 102.0 07/30/2019   CHOLHDL 3 07/30/2019     Diabetes Screening: age >7, overweight, family hx, PCOS, hx  of gestational diabetes, at risk ethnicity Diabetes Screening screening: Yes  No results found for: HGBA1C   Past Medical History:  Diagnosis Date  . Arthritis   . Asthma   . COPD (chronic obstructive pulmonary disease) ([place])   . GERD (gastroesophageal reflux disease)   . Hypertension   . Thyroid disease     Past Surgical History:  Procedure Laterality Date  . Arm surgery Left    for pinched nerve  . CYST REMOVAL HAND Right   . CYST REMOVAL NECK Left   . 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 HAND SURGERY Bilateral    to re  route nerves for hand weakness  . KNEE ARTHROSCOPY Right   . OVARIAN CYST SURGERY     removed cyst and tube-not sure of the side  . THYROIDECTOMY, PARTIAL Left     Prior to Admission medications   Medication Sig Start Date End Date Taking? Authorizing Provider  albuterol (VENTOLIN HFA) 108 (90 Base) MCG/ACT inhaler Inhale 1 puff into the lungs every 6 (six) hours as needed. 06/02/19 06/01/20 Yes [provider]  atorvastatin (LIPITOR) 10 MG tablet Take 1 tablet (10 mg total) by mouth daily. 07/30/19  Yes Lesleigh Noe, MD  Fluticasone-Salmeterol (ADVAIR) 250-50 MCG/DOSE AEPB Inhale 1 puff into the lungs every 12 (twelve) hours. 07/11/19  Yes [provider]  hydrOXYzine (ATARAX/VISTARIL) 25 MG tablet Take 1 tablet (25 mg total) by mouth 3 (three) times daily as needed. For itching 11/04/19 08/25/20 Yes Lesleigh Noe, MD  levothyroxine (SYNTHROID) 88 MCG tablet Take 1 tablet (88 mcg total) by mouth daily. 07/30/19  Yes Lesleigh Noe, MD  losartan (COZAAR) 50 MG tablet Take 1 tablet (50 mg total) by mouth daily. 08/26/19  Yes Lesleigh Noe, MD  montelukast (SINGULAIR) 10 MG tablet Take 10 mg by mouth daily. 05/25/19  Yes [provider]  nicotine (NICODERM CQ - DOSED IN MG/24 HOURS) 14 mg/24hr patch Place 1 patch (14 mg total) onto the skin daily. 07/30/19  Yes Lesleigh Noe, MD  nicotine (NICODERM CQ - DOSED IN MG/24 HR) 7 mg/24hr patch Place 1 patch (7 mg total) onto the skin daily. 07/30/19  Yes Lesleigh Noe, MD  tiotropium (SPIRIVA) 18 MCG inhalation capsule Place 1 capsule (18 mcg total) into inhaler and inhale daily. 08/26/19  Yes Lesleigh Noe, MD    Allergies  Allergen Reactions  . Pravastatin Other (See Comments)    Muscle pain    Gynecologic History: No LMP recorded. Patient is postmenopausal.  Obstetric History: No obstetric history on file.  Social History   Socioeconomic History  . Marital status: Single    Spouse name: Berneta Sages  . Number  of children: 1  . Years of education: 8th grade  . Highest education level: Not on file  Occupational History  . Not on file  Tobacco Use  . Smoking status: Current Every Day Smoker    Packs/day: 0.25    Years: 30.00    Pack years: 7.50    Types: Cigarettes  . Smokeless tobacco: Never Used  Vaping Use  . Vaping Use: Never used  Substance and Sexual Activity  . Alcohol use: Yes    Comment: wine-2 glasses at night-not every night  . Drug use: Never  . Sexual activity: Not Currently  Other Topics Concern  . Not on file  Social History Narrative   07/30/19   From: from [place], [place]   Living:  with a partner Berneta Sages) x 5 years   Work: stopped working 2 years ago, worked for [employer], hasn't found work recently [employer] a few days a week      Family: Daughter - Micheline Chapman      Enjoys: visit daughter      Exercise: not currently   Diet: avoids fried foods, days she works eating some Corporate treasurer belts: Yes    Guns: No   Safe in relationships: Yes    Social Determinants of Radio broadcast assistant Strain:   . Difficulty of Paying Living Expenses:   Food Insecurity:   . Worried About Charity fundraiser in the Last Year:   . Arboriculturist in the Last Year:   Transportation Needs:   . Film/video editor (Medical):   Marland Kitchen Lack of Transportation (Non-Medical):   Physical Activity:   . Days of Exercise per Week:   . Minutes of Exercise per Session:   Stress:   . Feeling of Stress :   Social Connections:   . Frequency of Communication with Friends and Family:   . Frequency of Social Gatherings with Friends and Family:   . Attends Religious Services:   . Active Member of Clubs or Organizations:   . Attends Archivist Meetings:   Marland Kitchen Marital Status:   Intimate Partner Violence:   . Fear of Current or Ex-Partner:   . Emotionally Abused:   Marland Kitchen Physically Abused:   . Sexually Abused:     Family History  Problem Relation Age of  Onset  . Breast cancer Maternal Aunt   . Alcohol abuse Mother   . Early death Mother        appendix rupture  . Depression Mother   . Stomach cancer Brother   . Alcohol abuse Brother   . Drug abuse Brother     Review of Systems  Constitutional: Negative for chills and fever.  HENT: Negative for congestion and sore throat.   Eyes: Negative for blurred vision and double vision.  Respiratory: Negative for shortness of breath.   Cardiovascular: Negative for chest pain.  Gastrointestinal: Negative for heartburn, nausea and vomiting.  Genitourinary: Negative.   Musculoskeletal: Negative.  Negative for myalgias.  Skin: Negative for rash.  Neurological: Negative for dizziness and headaches.  Endo/Heme/Allergies: Does not bruise/bleed easily.  Psychiatric/Behavioral: Negative for depression. The patient is not nervous/anxious.      Physical Exam BP 124/82   Pulse 74   Temp 98.3 F (36.8 C) (Temporal)   Ht 5' 2"  (1.575 m) Comment: without shoes  Wt 147 lb (66.7 kg)   SpO2 96%   BMI 26.89 kg/m    BP Readings from Last 3 Encounters:  12/24/19 124/82  09/21/19 124/72  09/07/19 110/72      Physical Exam Exam conducted with a chaperone present.  Constitutional:      General: She is not in acute distress.    Appearance: She is well-developed. She is not diaphoretic.  HENT:     Head: Normocephalic and atraumatic.     Right Ear: External ear normal.     Left Ear: External ear normal.     Nose: Nose normal.     Mouth/Throat:     Mouth: Mucous membranes are moist.     Pharynx: No oropharyngeal exudate or posterior oropharyngeal erythema.  Eyes:     General: No scleral icterus.    Conjunctiva/sclera:  Conjunctivae normal.  Cardiovascular:     Rate and Rhythm: Normal rate and regular rhythm.     Heart sounds: No murmur heard.   Pulmonary:     Effort: Pulmonary effort is normal. No respiratory distress.     Breath sounds: Normal breath sounds. No wheezing.  Abdominal:      General: Bowel sounds are normal. There is no distension.     Palpations: Abdomen is soft. There is no mass.     Tenderness: There is no abdominal tenderness. There is no guarding or rebound.  Genitourinary:    Labia:        Right: No rash.        Left: No rash.      Vagina: Normal.     Cervix: Normal.  Musculoskeletal:        General: Normal range of motion.     Cervical back: Neck supple.  Lymphadenopathy:     Cervical: No cervical adenopathy.  Skin:    General: Skin is warm and dry.     Capillary Refill: Capillary refill takes less than 2 seconds.  Neurological:     Mental Status: She is alert and oriented to person, place, and time.     Deep Tendon Reflexes: Reflexes normal.  Psychiatric:        Behavior: Behavior normal.     Results:  PHQ-9:  additional stressors and recent family death. Has good social support. F/u if worsening    Assessment: 64 y.o. No obstetric history on file. female here for routine annual physical examination.  Plan: Problem List Items Addressed This Visit    None    Visit Diagnoses    Annual physical exam    -  Primary   Colon cancer screening       Relevant Orders   Fecal occult blood, imunochemical   Cervical cancer screening       Relevant Orders   Cytology - PAP      Screening: -- Blood pressure screen normal -- cholesterol screening: not due for screening -- Weight screening: overweight: continue to monitor -- Diabetes Screening: not due for screening -- Nutrition: normal - encouraged healthy diet  The 10-year ASCVD risk score Mikey Bussing DC Jr., et al., 2013) is: 10.3%   Values used to calculate the score:     Age: 26 years     Sex: Female     Is Non-Hispanic African American: No     Diabetic: No     Tobacco smoker: Yes     Systolic Blood Pressure: 395 mmHg     Is BP treated: Yes     HDL Cholesterol: 54.4 mg/dL     Total Cholesterol: 157 mg/dL  -- Statin therapy for Age 78-75 with CVD risk >7.5%  Pt on  atorvastatin   Psych -- Depression screening (PHQ-9):  increased stress but managing with family support  Safety -- tobacco screening: encouraged cessation.  -- alcohol screening:  low-risk usage. -- no evidence of domestic violence or intimate partner violence.   Cancer Screening -- pap smear collected per ASCCP guidelines -- family history of breast cancer screening: done. not at high risk. -- Mammogram - pt just had this done and was normal, repeat next year  -- Colon cancer - discussed and plan for FOBT given today  Immunizations -- flu vaccine up to date -- TDAP q10 years up to date  Encouraged regular dental and vision care. Healthy eating and exercise    Lesleigh Noe, MD

## 2019-12-24 NOTE — Patient Instructions (Signed)

## 2019-12-25 LAB — CYTOLOGY - PAP
Comment: NEGATIVE
Diagnosis: NEGATIVE
High risk HPV: NEGATIVE

## 2019-12-29 ENCOUNTER — Other Ambulatory Visit (INDEPENDENT_AMBULATORY_CARE_PROVIDER_SITE_OTHER): Payer: 59

## 2019-12-29 DIAGNOSIS — Z1211 Encounter for screening for malignant neoplasm of colon: Secondary | ICD-10-CM

## 2019-12-29 LAB — FECAL OCCULT BLOOD, IMMUNOCHEMICAL: Fecal Occult Bld: NEGATIVE

## 2020-03-04 ENCOUNTER — Other Ambulatory Visit: Payer: Self-pay | Admitting: Family Medicine

## 2020-03-04 MED ORDER — ALBUTEROL SULFATE HFA 108 (90 BASE) MCG/ACT IN AERS: 1.0000 | INHALATION_SPRAY | Freq: Four times a day (QID) | RESPIRATORY_TRACT | 2 refills | Status: DC | PRN

## 2020-03-04 NOTE — Telephone Encounter (Signed)
Refills sent as requested

## 2020-03-04 NOTE — Telephone Encounter (Signed)
Medication Refill - Medication:  albuterol (VENTOLIN HFA) 108 (90 Base) MCG/ACT inhaler    Has the patient contacted their pharmacy? Yes advised to call as this will be a new script from PCP but previously given by old PCP.   Preferred Pharmacy (with phone number or street name):  [facility], [place] Phone:  [phone]  Fax:  [phone]

## 2020-03-09 ENCOUNTER — Other Ambulatory Visit: Payer: Self-pay

## 2020-03-09 MED ORDER — FLUTICASONE-SALMETEROL 250-50 MCG/DOSE IN AEPB: 1.0000 | INHALATION_SPRAY | Freq: Two times a day (BID) | RESPIRATORY_TRACT | 0 refills | Status: DC

## 2020-03-09 NOTE — Telephone Encounter (Signed)
Pt left v/m requesting status of advair refill; per [facility] [address] the Advair has not previously been prescribed by Dr Einar Pheasant but instead has been filled by Dr Malachi Pro at [facility]. [facility] [address] will send refill request. See 01/24/20 refill request. Pt had annual exam with Dr Einar Pheasant on 12/24/19.

## 2020-04-11 ENCOUNTER — Encounter: Payer: Self-pay | Admitting: Family Medicine

## 2020-04-11 ENCOUNTER — Ambulatory Visit (INDEPENDENT_AMBULATORY_CARE_PROVIDER_SITE_OTHER): Payer: 59 | Admitting: Family Medicine

## 2020-04-11 ENCOUNTER — Other Ambulatory Visit: Payer: Self-pay

## 2020-04-11 VITALS — BP 110/64 | HR 86 | Temp 97.4°F | Ht 62.0 in | Wt 144.2 lb

## 2020-04-11 DIAGNOSIS — Z23 Encounter for immunization: Secondary | ICD-10-CM

## 2020-04-11 DIAGNOSIS — E039 Hypothyroidism, unspecified: Secondary | ICD-10-CM

## 2020-04-11 DIAGNOSIS — F17200 Nicotine dependence, unspecified, uncomplicated: Secondary | ICD-10-CM

## 2020-04-11 DIAGNOSIS — I10 Essential (primary) hypertension: Secondary | ICD-10-CM

## 2020-04-11 DIAGNOSIS — E782 Mixed hyperlipidemia: Secondary | ICD-10-CM

## 2020-04-11 DIAGNOSIS — L299 Pruritus, unspecified: Secondary | ICD-10-CM

## 2020-04-11 DIAGNOSIS — J42 Unspecified chronic bronchitis: Secondary | ICD-10-CM

## 2020-04-11 DIAGNOSIS — E038 Other specified hypothyroidism: Secondary | ICD-10-CM

## 2020-04-11 MED ORDER — NICOTINE 14 MG/24HR TD PT24: 14.0000 mg | MEDICATED_PATCH | Freq: Every day | TRANSDERMAL | 0 refills | Status: AC

## 2020-04-11 MED ORDER — HYDROXYZINE HCL 25 MG PO TABS: 25.0000 mg | ORAL_TABLET | Freq: Three times a day (TID) | ORAL | 1 refills | Status: AC | PRN

## 2020-04-11 MED ORDER — LEVOTHYROXINE SODIUM 88 MCG PO TABS: 88.0000 ug | ORAL_TABLET | Freq: Every day | ORAL | 0 refills | Status: DC

## 2020-04-11 MED ORDER — ATORVASTATIN CALCIUM 10 MG PO TABS: 10.0000 mg | ORAL_TABLET | Freq: Every day | ORAL | 0 refills | Status: DC

## 2020-04-11 MED ORDER — MONTELUKAST SODIUM 10 MG PO TABS
10.0000 mg | ORAL_TABLET | ORAL | 0 refills | Status: DC | PRN
Start: 2020-04-11 — End: 2020-11-16

## 2020-04-11 MED ORDER — LOSARTAN POTASSIUM 50 MG PO TABS: 50.0000 mg | ORAL_TABLET | Freq: Every day | ORAL | 0 refills | Status: AC

## 2020-04-11 MED ORDER — ALBUTEROL SULFATE HFA 108 (90 BASE) MCG/ACT IN AERS: 1.0000 | INHALATION_SPRAY | Freq: Four times a day (QID) | RESPIRATORY_TRACT | 0 refills | Status: AC | PRN

## 2020-04-11 MED ORDER — FLUTICASONE-SALMETEROL 250-50 MCG/DOSE IN AEPB: 1.0000 | INHALATION_SPRAY | Freq: Two times a day (BID) | RESPIRATORY_TRACT | 0 refills | Status: AC

## 2020-04-11 MED ORDER — TIOTROPIUM BROMIDE MONOHYDRATE 18 MCG IN CAPS: 18.0000 ug | ORAL_CAPSULE | Freq: Every day | RESPIRATORY_TRACT | 3 refills | Status: AC

## 2020-04-11 NOTE — Assessment & Plan Note (Signed)
Lab Results  Component Value Date   TSH 1.73 09/21/2019   tsh at goal. Cont levothyroxine

## 2020-04-11 NOTE — Progress Notes (Signed)
Subjective:     Dawn Day is a 64 y.o. 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 Packs/day: 0.25  . Years: 30.00  . Pack years: 7.50  . Types: Cigarettes  Smokeless Tobacco Never Used        Objective:    BP Readings from Last 3 Encounters:  04/11/20 110/64  12/24/19 124/82  09/21/19 124/72   Wt Readings from Last 3 Encounters:  04/11/20 144 lb 4 oz (65.4 kg)  12/24/19 147 lb (66.7 kg)  09/21/19 151 lb 4 oz (68.6 kg)    BP 110/64   Pulse 86   Temp (!) 97.4 F (36.3 C) (Temporal)   Ht 5\' 2"  (1.575 m)   Wt 144 lb 4 oz (65.4 kg)   SpO2 95%   BMI 26.38 kg/m    Physical Exam Constitutional:      General: She is not in acute distress.    Appearance: She is well-developed. She is not diaphoretic.  HENT:     Right Ear: External ear normal.     Left Ear: External ear normal.  Eyes:     Conjunctiva/sclera: Conjunctivae normal.  Cardiovascular:     Rate and Rhythm: Normal rate and regular rhythm.     Heart sounds: No murmur heard.   Pulmonary:     Effort: Pulmonary effort is normal. No respiratory distress.     Breath sounds: Normal breath sounds. No wheezing.  Musculoskeletal:     Cervical back: Neck supple.  Skin:    General: Skin is warm and dry.     Capillary Refill: Capillary refill takes less than 2 seconds.  Neurological:     Mental Status: She is alert. Mental status is at baseline.  Psychiatric:        Mood and Affect: Mood normal.        Behavior: Behavior normal.           Assessment & Plan:   Problem List Items Addressed This Visit      Cardiovascular and Mediastinum   Essential hypertension    BP controled. Cont losartan       Relevant Medications   atorvastatin (LIPITOR) 10 MG tablet   losartan (COZAAR) 50 MG tablet     Respiratory   COPD (chronic obstructive pulmonary disease) (HCC)    Good control. Cont spiriva and advair and singulair. 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 Encouraged continued smoking cessation. Albuterol prn      Relevant Medications   nicotine (NICODERM CQ - DOSED IN MG/24 HOURS) 14 mg/24hr patch   montelukast (SINGULAIR) 10 MG tablet   tiotropium (SPIRIVA) 18 MCG inhalation capsule   Fluticasone-Salmeterol (ADVAIR) 250-50 MCG/DOSE AEPB   albuterol (VENTOLIN HFA) 108 (90 Base) MCG/ACT inhaler     Endocrine   Hypothyroid    Lab Results  Component Value Date   TSH 1.73 09/21/2019   tsh at goal. Cont levothyroxine      Relevant Medications   levothyroxine (SYNTHROID) 88 MCG tablet     Other   Hyperlipidemia, mixed   Relevant Medications  atorvastatin (LIPITOR) 10 MG tablet   losartan (COZAAR) 50 MG tablet   Tobacco use disorder    Encouraged efforts to quit. Refill prescription      Relevant Medications   nicotine (NICODERM CQ - DOSED IN MG/24 HOURS) 14 mg/24hr patch   Chronic pruritus    Cont topical lotion and hydroxyzine prn      Relevant Medications   hydrOXYzine (ATARAX/VISTARIL) 25 MG tablet    Other Visit Diagnoses    Need for influenza vaccination    -  Primary   Relevant Orders   Flu Vaccine QUAD 36+ mos IM (Completed)       Return in about 6 months (around 10/10/2020).  Lesleigh Noe, MD  This visit occurred during the SARS-CoV-2 public health emergency.  Safety protocols were in place, including screening questions prior to the visit, additional usage of staff PPE, and extensive cleaning of exam room while observing appropriate contact time as indicated for disinfecting solutions.

## 2020-04-11 NOTE — Assessment & Plan Note (Signed)
Good control. Cont spiriva and advair and singulair. 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 Encouraged continued smoking cessation. Albuterol prn

## 2020-04-11 NOTE — Assessment & Plan Note (Signed)
Cont topical lotion and hydroxyzine prn

## 2020-04-11 NOTE — Assessment & Plan Note (Signed)
Encouraged efforts to quit. Refill prescription

## 2020-04-11 NOTE — Assessment & Plan Note (Signed)
BP controled. Cont losartan

## 2020-06-09 ENCOUNTER — Encounter: Payer: Self-pay | Admitting: Family Medicine

## 2020-06-09 ENCOUNTER — Telehealth: Payer: Self-pay

## 2020-06-09 ENCOUNTER — Telehealth (INDEPENDENT_AMBULATORY_CARE_PROVIDER_SITE_OTHER): Payer: 59 | Admitting: Family Medicine

## 2020-06-09 VITALS — BP 171/119 | HR 80

## 2020-06-09 DIAGNOSIS — J441 Chronic obstructive pulmonary disease with (acute) exacerbation: Secondary | ICD-10-CM | POA: Diagnosis not present

## 2020-06-09 MED ORDER — AZITHROMYCIN 250 MG PO TABS: ORAL_TABLET | ORAL | 0 refills | Status: AC

## 2020-06-09 MED ORDER — PREDNISONE 20 MG PO TABS: 40.0000 mg | ORAL_TABLET | Freq: Every day | ORAL | 0 refills | Status: AC

## 2020-06-09 MED ORDER — BENZONATATE 100 MG PO CAPS: 100.0000 mg | ORAL_CAPSULE | Freq: Three times a day (TID) | ORAL | 0 refills | Status: AC | PRN

## 2020-06-09 NOTE — Telephone Encounter (Signed)
Patient called back in and scheduled  

## 2020-06-09 NOTE — Telephone Encounter (Signed)
[facility] Night - Client Nonclinical Telephone Record AccessNurse Client [facility] - Client Client Site [place] Physician Waunita Schooner- MD Contact Type Call Who Is Calling Patient / Member / Family / Caregiver Caller Name [place] Phone Number [phone] Patient Name Dawn Day Patient DOB 1956-06-21 Call Type Message Only Information Provided Reason for Call Request to Schedule Office Appointment Initial Comment The caller needs to make an appt. Disp. Time Disposition Final User 06/09/2020 7:53:01 AM General Information Provided Yes Carlis Stable Call Closed By: Carlis Stable Transaction Date/Time: 06/09/2020 7:50:08 AM (ET)

## 2020-06-09 NOTE — Progress Notes (Signed)
Virtual Visit via Telephone Note  I connected with Dawn Day on 06/09/20 at  4:20 PM EST by telephone and verified that I am speaking with the correct person using two identifiers.   I discussed the limitations, risks, security and privacy concerns of performing an evaluation and management service by telephone and the availability of in person appointments. I also discussed with the patient that there may be a patient responsible charge related to this service. The patient expressed understanding and agreed to proceed.  Patient location[facility] [place] Provider Location: [facility] Participants: Lesleigh Noe and Dawn Day   History of Present Illness: Chief Complaint  Patient presents with  . Sinusitis    with yellow discharge x 3 days   . Cough    with yellow discharge     Sinusitis This is a new problem. The current episode started in the past 7 days. The problem has been gradually worsening since onset. There has been no fever. Associated symptoms include congestion, coughing, headaches, sinus pressure and a sore throat. Pertinent negatives include no chills, ear pain or shortness of breath. Past treatments include oral decongestants. The treatment provided no relief.  Cough Associated symptoms include headaches and a sore throat. Pertinent negatives include no chills, ear pain or shortness of breath.   06/07/2020  Feels like it is more in the head Mild wheezing  Has been using inhaler   Had her booster shot, no known sick contact No loss of taste or smell  Has not tried a saline rinse or neti pot - will burn the nose  I suppose to be going back to [facility] tomorrow   Review of Systems  Constitutional: Negative for chills.  HENT: Positive for congestion, sinus pressure and sore throat. Negative for ear pain.   Respiratory: Positive for cough. Negative for shortness of breath.   Neurological: Positive for headaches.       Observations/Objective: BP (!) 171/119 Comment: with wrist cuff  Pulse 80   Phone visit:  Patient speaking in complete sentences No distress Alert and oriented Normal mood  Assessment and Plan: Problem List Items Addressed This Visit    None    Visit Diagnoses    COPD exacerbation ([place])    -  Primary   Relevant Medications   predniSONE (DELTASONE) 20 MG tablet   benzonatate (TESSALON PERLES) 100 MG capsule   azithromycin (ZITHROMAX) 250 MG tablet     Pt notes productive cough and wheezing in additional to sinus symptoms.   Recommended covid testing, she declined. She would not want monoclonal antibody infusion and agreed to isolate.   Follow Up Instructions:  Return if symptoms worsen or fail to improve.   I discussed the assessment and treatment plan with the patient. The patient was provided an opportunity to ask questions and all were answered. The patient agreed with the plan and demonstrated an understanding of the instructions.   The patient was advised to call back or seek an in-person evaluation if the symptoms worsen or if the condition fails to improve as anticipated.  I provided 15 minutes of non-face-to-face time during this encounter.  12:57 1:12  Lesleigh Noe, MD

## 2020-08-01 ENCOUNTER — Other Ambulatory Visit: Payer: Self-pay

## 2020-08-01 ENCOUNTER — Encounter: Payer: Self-pay | Admitting: Family Medicine

## 2020-08-01 DIAGNOSIS — E039 Hypothyroidism, unspecified: Secondary | ICD-10-CM

## 2020-08-01 MED ORDER — LEVOTHYROXINE SODIUM 88 MCG PO TABS: 88.0000 ug | ORAL_TABLET | Freq: Every day | ORAL | 0 refills | Status: AC

## 2020-08-02 ENCOUNTER — Other Ambulatory Visit: Payer: Self-pay

## 2020-08-02 DIAGNOSIS — E782 Mixed hyperlipidemia: Secondary | ICD-10-CM

## 2020-08-02 MED ORDER — ATORVASTATIN CALCIUM 10 MG PO TABS: 10.0000 mg | ORAL_TABLET | Freq: Every day | ORAL | 0 refills | Status: AC

## 2020-08-18 LAB — COMPREHENSIVE METABOLIC PANEL
ALT: 14 U/L (ref 0–33)
AST: 21 U/L (ref 0–32)
Albumin/Globulin Ratio: 2.2 mmol/L (ref 1.00–2.70)
Albumin: 4.8 g/dL (ref 3.5–5.2)
Alk Phosphatase: 84 U/L (ref 35–117)
Anion Gap: 12 mmol/L (ref 2–17)
BUN: 11 mg/dL (ref 8–23)
CO2: 26 mmol/L (ref 22–29)
Calcium: 9.9 mg/dL (ref 8.8–10.2)
Chloride: 101 mmol/L (ref 98–107)
Creatinine: 0.8 mg/dL (ref 0.5–1.0)
GFR African American: 90 mL/min/{1.73_m2} (ref >=90)
GFR Non-African American: 77 mL/min/{1.73_m2} — ABNORMAL LOW (ref >=90)
Globulin: 2 g/dL (ref 1.9–4.4)
Glucose: 82 mg/dL (ref 70–99)
OSMOLALITY CALCULATED: 276 mOsm/kg (ref 270–287)
Potassium: 4.3 mmol/L (ref 3.5–5.3)
Sodium: 139 mmol/L (ref 135–145)
Total Bilirubin: 0.39 mg/dL (ref 0.00–1.20)
Total Protein: 7 g/dL (ref 6.4–8.3)

## 2020-08-18 LAB — CBC
Hematocrit: 40.2 % (ref 34.0–47.0)
Hemoglobin: 14.1 g/dL (ref 11.5–15.7)
MCH: 32.4 pg (ref 27.0–34.5)
MCHC: 35.1 g/dL (ref 32.0–36.0)
MCV: 92.4 fL (ref 81.0–99.0)
MPV: 9.4 fL (ref 7.2–13.2)
Platelets: 165 10*3/uL (ref 140–440)
RBC: 4.35 x10e6/mcL (ref 3.60–5.20)
RDW: 12.3 % (ref 11.0–16.0)
WBC: 6.2 10*3/uL (ref 3.8–10.6)

## 2020-08-18 LAB — URINALYSIS-MACROSCOPIC
Bilirubin Urine: NEGATIVE
Blood, Urine: NEGATIVE
Glucose, UA: NEGATIVE mg/dL
Ketones, Urine: NEGATIVE mg/dL
Leukocyte Esterase, Urine: NEGATIVE
Nitrite, Urine: NEGATIVE
Protein, UA: NEGATIVE
Specific Gravity, UA: 1.015 (ref 1.003–1.035)
Urobilinogen, Urine: 0.2 EU/dL
pH, UA: 6 (ref 4.5–8.0)

## 2020-08-18 LAB — SEDIMENTATION RATE: Sed Rate: 4 mm/hr (ref 0–30)

## 2020-08-18 LAB — URIC ACID: Uric Acid: 5.4 mg/dL (ref 2.4–5.7)

## 2020-08-18 LAB — TSH: TSH, 3RD GENERATION: 2.47 mcIU/mL (ref 0.358–3.740)

## 2020-08-18 LAB — MAGNESIUM: Magnesium: 1.1 mg/dL — ABNORMAL LOW (ref 1.6–2.6)

## 2020-08-19 LAB — LIPID PANEL
Chol/HDL Ratio: 2.7 (ref 0.0–4.4)
Cholesterol: 160 mg/dL (ref 100–200)
HDL: 59 mg/dL (ref >=50)
LDL Cholesterol: 53 mg/dL (ref 0.0–100.0)
LDL/HDL Ratio: 1
Triglycerides: 239 mg/dL — ABNORMAL HIGH (ref 0–149)
VLDL: 47.8 mg/dL — ABNORMAL HIGH (ref 5.0–40.0)

## 2020-08-19 LAB — IRON AND TIBC
Iron Saturation: 43 % — ABNORMAL HIGH (ref 20–40)
Iron: 123 ug/dL (ref 37–145)
TIBC: 283 ug/dL (ref 250–450)
UIBC: 160 ug/dL (ref 112.0–347.0)

## 2020-08-19 LAB — HEMOGLOBIN A1C
Est. Avg. Glucose, WB: 88
Est. Avg. Glucose-calculated: 90
Hemoglobin A1C: 4.7 % (ref 4.0–6.0)

## 2020-08-19 LAB — C-REACTIVE PROTEIN HIGH SENSITIVITY: CRP High Sensitivity: 2.47 mg/L (ref 0.00–5.00)

## 2020-08-19 LAB — FERRITIN: Ferritin: 274.2 ng/mL — ABNORMAL HIGH (ref 13.0–150.0)

## 2020-08-19 LAB — T4, FREE: T4 Free: 1.44 ng/dL (ref 0.82–1.70)

## 2020-08-19 LAB — VITAMIN B12: Vitamin B-12: 408 pg/mL (ref 232–1245)

## 2020-08-20 LAB — THYROID ANTIBODIES
Thyroglobulin Ab: 11.2 IU/mL — ABNORMAL HIGH (ref 0.0–0.9)
Thyroid Peroxidase (TPO) Abs: 97 IU/mL — ABNORMAL HIGH (ref 0–34)

## 2020-08-20 LAB — RHEUMATOID FACTOR: Rheumatoid Factor Quant: 21 IU/mL — ABNORMAL HIGH (ref 0.0–19.0)

## 2020-08-20 LAB — T3, FREE: T3, Free: 2.68 pg/mL (ref 2.00–4.40)

## 2020-08-20 LAB — VITAMIN D 25 HYDROXY: Vit D, 25-Hydroxy: 19.9 ng/mL — ABNORMAL LOW (ref 30.0–90.0)

## 2020-08-23 LAB — ANA: ANA by IFA: NEGATIVE

## 2020-09-24 LAB — MAGNESIUM: Magnesium: 2.3 mg/dL (ref 1.6–2.6)

## 2020-11-15 LAB — CBC WITH AUTO DIFFERENTIAL
Absolute Baso #: 0 10*3/uL (ref 0.0–0.2)
Absolute Eos #: 0.1 10*3/uL (ref 0.0–0.5)
Absolute Lymph #: 2.1 10*3/uL (ref 1.0–3.2)
Absolute Mono #: 0.5 10*3/uL (ref 0.3–1.0)
Basophils %: 0.6 % (ref 0.0–2.0)
Eosinophils %: 1.7 % (ref 0.0–7.0)
Hematocrit: 39.5 % (ref 34.0–47.0)
Hemoglobin: 13.8 g/dL (ref 11.5–15.7)
Immature Grans (Abs): 0.02 10*3/uL (ref 0.00–0.06)
Immature Granulocytes: 0.3 % (ref 0.0–0.6)
Lymphocytes: 30.8 % (ref 15.0–45.0)
MCH: 32.9 pg (ref 27.0–34.5)
MCHC: 34.9 g/dL (ref 32.0–36.0)
MCV: 94 fL (ref 81.0–99.0)
MPV: 9.4 fL (ref 7.2–13.2)
Monocytes: 6.5 % (ref 4.0–12.0)
Neutrophils %: 60.1 % (ref 42.0–74.0)
Neutrophils Absolute: 4.2 10*3/uL (ref 1.6–7.3)
Platelets: 162 10*3/uL (ref 140–440)
RBC: 4.2 x10e6/mcL (ref 3.60–5.20)
RDW: 12.5 % (ref 11.0–16.0)
WBC: 6.9 10*3/uL (ref 3.8–10.6)

## 2020-11-15 LAB — HEPATIC FUNCTION PANEL
ALT: 16 U/L (ref 0–35)
AST: 30 U/L (ref 0–35)
Albumin: 4.7 g/dL (ref 3.5–5.2)
Alk Phosphatase: 95 U/L (ref 35–117)
Bilirubin, Direct: <0.2 mg/dL (ref 0.00–0.30)
Total Bilirubin: 0.54 mg/dL (ref 0.00–1.20)
Total Protein: 7.6 g/dL (ref 6.4–8.3)

## 2020-11-15 LAB — POC URINALYSIS, CHEMISTRY
Bilirubin, Urine, POC: NEGATIVE
Glucose, UA POC: NEGATIVE mg/dL
Ketones, Urine, POC: NEGATIVE mg/dL
Leukocytes, UA: NEGATIVE
Nitrate, UA POC: NEGATIVE
Protein, Urine, POC: NEGATIVE
Specific Gravity, Urine, POC: 1.015 (ref 1.003–1.035)
Urine Urobilinogen: 0.2 EU/dL
pH, Urine, POC: 6.5 (ref 4.5–8.0)

## 2020-11-15 LAB — BASIC METABOLIC PANEL
Anion Gap: 12 mmol/L (ref 2–17)
BUN: 16 mg/dL (ref 8–23)
CO2: 23 mmol/L (ref 22–29)
Calcium: 9.6 mg/dL (ref 8.8–10.2)
Chloride: 102 mmol/L (ref 98–107)
Creatinine: 0.7 mg/dL (ref 0.5–1.0)
GFR African American: 105 mL/min/{1.73_m2} (ref >=90)
GFR Non-African American: 91 mL/min/{1.73_m2} (ref >=90)
Glucose: 94 mg/dL (ref 70–99)
OSMOLALITY CALCULATED: 273 mOsm/kg (ref 270–287)
Potassium: 4.1 mmol/L (ref 3.5–5.3)
Sodium: 136 mmol/L (ref 135–145)

## 2020-11-15 LAB — LIPASE: Lipase: 33 U/L (ref 13–60)

## 2020-11-15 LAB — LACTIC ACID: Lactic Acid: 0.6 mmol/L (ref 0.5–2.0)

## 2020-11-15 NOTE — ED Notes (Signed)
 ED Patient Summary       ;          [facility]  [address] [place], [place] [address]  [phone]  Discharge Instructions (Patient)  Name:Melanie Sandoval, Melanie Sandoval  DOB:  16-Jul-1955                   MRN: [id number]                   FIN: [id number]  Reason For Visit: Abdominal pain; ABD PAIN  Final Diagnosis: Cirrhosis of liver; Right sided abdominal pain     Visit Date: 11/15/2020 18:12:00  Address: [address] [place] [address]  Phone: [phone]     Emergency Department Providers:         Primary Physician:      CLAUDENE COY [facility] would like to thank you for allowing us  to assist you with your healthcare needs. The following includes patient education materials and information regarding your injury/illness.     Follow-up Instructions:  You were seen today on an emergency basis. Please contact your primary care doctor for a follow up appointment. If you received a referral to a specialist doctor, it is important you follow-up as instructed.    It is important that you call your follow-up doctor to schedule and confirm the location of your next appointment. Your doctor may practice at multiple locations. The office location of your follow-up appointment may be different to the one written on your discharge instructions.    If you do not have a primary care doctor, please call [phone] for help in finding a [facility] Provider. For help in finding a specialist doctor, please call [phone].    If your condition gets worse before your follow-up with your primary care doctor or specialist, please return to the Emergency Department.      Coronavirus 2019 (COVID-19) Reminders:     Patients age 10 - 79, with parental consent, and over age 79 can make an appointment for a COVID-19 vaccine. Patients can contact their [facility] doctors' offices to schedule an appointment to receive the COVID-19  vaccine. Patients who do not have a [facility] can call [phone] to schedule vaccination appointments.      Follow Up Appointments:  Primary Care Provider:     Name: DEVRA BARMAN     Phone: [phone]                 With: Address: When:   [facility] Call for appt & office location   [phone] Business (1) Within 1 to 2 days   Comments:   Please return to the Emergency Department with any worsening or persistent symptoms. Please follow-up with recommended physician in the time frame listed. Please call with any problems or concerns. If you have no primary care doctor, please call [phone] to establish care with one. Thank you for choosing [facility]!                 Post [facility] SERVICES%>             New Medications  Printed Prescriptions  dicyclomine (Bentyl 20 mg oral tablet) 1 Tabs Oral (given by mouth) 3 times a day as needed moderate pain (4-7) for 5 Days. Refills: 0.  Last Dose:____________________  Medications that have  not changed  Other Medications  albuterol  (Albuterol  (Eqv-ProAir  HFA) 90 mcg/inh inhalation aerosol) 2 Puffs.  Last Dose:____________________  atorvastatin  (Lipitor  10 mg oral tablet) 1 Tabs Oral (given by mouth) every day.  Last Dose:____________________  atorvastatin  (Lipitor  10 mg oral tablet) Oral (given by mouth) every day.  Last Dose:____________________  fluticasone -salmeterol (fluticasone -salmeterol 250 mcg-50 mcg inhalation powder)   Last Dose:____________________  hydrOXYzine  (hydrOXYzine  hydrochloride 25 mg oral tablet)   Last Dose:____________________  levothyroxine  (Synthroid ) Oral (given by mouth) every day.  Last Dose:____________________  losartan  (losartan  50 mg oral tablet)   Last Dose:____________________  magnesium  aspartate Oral (given by mouth) 2 times a day.  Last Dose:____________________  montelukast  (montelukast  10 mg oral tablet)   Last Dose:____________________  montelukast   (Singulair  10 mg oral tablet) Oral (given by mouth) every day.  Last Dose:____________________      Allergy Info: No Known Medication Allergies     Discharge Additional Information          Discharge Patient 11/15/20 21:14:00 EDT      Patient Education Materials:        Abdominal Pain, Adult    Pain in the abdomen (abdominal pain) can be caused by many things. Often, abdominal pain is not serious and it gets better with no treatment or by being treated at home. However, sometimes abdominal pain is serious.    Your health care provider will ask questions about your medical history and do a physical exam to try to determine the cause of your abdominal pain.      Follow these instructions at home:    Medicines     Take over-the-counter and prescription medicines only as told by your health care provider.     Do not take a laxative unless told by your health care provider.      General instructions       Watch your condition for any changes.     Drink enough fluid to keep your urine pale yellow.     Keep all follow-up visits as told by your health care provider. This is important.        Contact a health care provider if:     Your abdominal pain changes or gets worse.     You are not hungry or you lose weight without trying.     You are constipated or have diarrhea for more than 2?3 days.     You have pain when you urinate or have a bowel movement.     Your abdominal pain wakes you up at night.     Your pain gets worse with meals, after eating, or with certain foods.     You are vomiting and cannot keep anything down.     You have a fever.     You have blood in your urine.      Get help right away if:     Your pain does not go away as soon as your health care provider told you to expect.     You cannot stop vomiting.     Your pain is only in areas of the abdomen, such as the right side or the left lower portion of the abdomen. Pain on the right side could be caused by appendicitis.     You have bloody or black stools,  or stools that look like tar.     You have severe pain, cramping, or bloating in your abdomen.     You have signs of dehydration, such  as:  ? Dark urine, very little urine, or no urine.    ? Cracked lips.    ? Dry mouth.    ? Sunken eyes.    ? Sleepiness.    ? Weakness.       You have trouble breathing or chest pain.      Summary     Often, abdominal pain is not serious and it gets better with no treatment or by being treated at home. However, sometimes abdominal pain is serious.     Watch your condition for any changes.     Take over-the-counter and prescription medicines only as told by your health care provider.     Contact a health care provider if your abdominal pain changes or gets worse.     Get help right away if you have severe pain, cramping, or bloating in your abdomen.      This information is not intended to replace advice given to you by your health care provider. Make sure you discuss any questions you have with your health care provider.      Document Revised: 08/14/2019 Document Reviewed: 11/03/2018  Elsevier Patient Education ? 2021 Elsevier Inc.       Cirrhosis      Cirrhosis is long-term (chronic) liver injury. The liver is the body's largest internal organ, and it performs many functions. It converts food into energy, removes toxic material from the blood, makes important proteins, and absorbs necessary vitamins from food.    In cirrhosis, healthy liver cells are replaced by scar tissue. This prevents blood from flowing through the liver, making it difficult for the liver to function. Scarring of the liver cannot be reversed, but treatment can prevent it from getting worse.      What are the causes?    Common causes of this condition are hepatitis C and long-term alcohol abuse. Other causes include:   Nonalcoholic fatty liver disease. This happens when fat is deposited in the liver by causes other than alcohol.     Hepatitis B infection.     Autoimmune hepatitis. In this condition, the body's  defense system (immune system) mistakenly attacks the liver cells, causing irritation and swelling (inflammation).     Diseases that cause blockage of ducts inside the liver.     Inherited liver diseases, such as hemochromatosis. This is one of the most common inherited liver diseases. In this disease, deposits of iron collect in the liver and other organs.     Reactions to certain long-term medicines, such as amiodarone, a heart medicine.     Parasitic infections. These include schistosomiasis, which is caused by a flatworm.     Long-term contact to certain toxins. These toxins include certain organic solvents, such as toluene and chloroform.        What increases the risk?    You are more likely to develop this condition if:   You have certain types of viral hepatitis.     You abuse alcohol, especially if you are female.     You are overweight.     You share needles.     You have unprotected sex with someone who has viral hepatitis.        What are the signs or symptoms?    You may not have any signs and symptoms at first. Symptoms may not develop until the damage to your liver starts to get worse.    Early symptoms may include:   Weakness and tiredness (fatigue).  Changes in sleep patterns or having trouble sleeping.     Itchiness.     Tenderness in the right-upper part of your abdomen.     Weight loss and muscle loss.     Nausea.     Loss of appetite.     Appearance of tiny blood vessels under the skin.      Later symptoms may include:   Fatigue or weakness that is getting worse.     Yellow skin and eyes (jaundice).     Buildup of fluid in the abdomen (ascites). You may notice that your clothes are tight around your waist.     Weight gain.     Swelling of the feet and ankles (edema).     Trouble breathing.     Easy bruising and bleeding.     Vomiting blood.     Black or bloody stool.     Mental confusion.        How is this diagnosed?    Your health care provider may suspect cirrhosis based on your symptoms  and medical history, especially if you have other medical conditions or a history of alcohol abuse. Your health care provider will do a physical exam to feel your liver and to check for signs of cirrhosis. He or she may perform other tests, including:   Blood tests to check:  ? For hepatitis B or C.    ? Kidney function.    ? Liver function.       Imaging tests such as:  ? MRI or CT scan to look for changes seen in advanced cirrhosis.    ? Ultrasound to see if normal liver tissue is being replaced by scar tissue.       A procedure in which a long needle is used to take a sample of liver tissue to be checked in a lab (biopsy). Liver biopsy can confirm the diagnosis of cirrhosis.        How is this treated?    Treatment for this condition depends on how damaged your liver is and what caused the damage. It may include treating the symptoms of cirrhosis, or treating the underlying causes in order to slow the damage. Treatment may include:   Making lifestyle changes, such as:  ? Eating a healthy diet. You may need to work with your health care provider or a diet and nutrition specialist (dietitian) to develop an eating plan.    ? Restricting salt intake.    ? Maintaining a healthy weight.    ? Not abusing drugs or alcohol.       Taking medicines to:  ? Treat liver infections or other infections.    ? Control itching.    ? Reduce fluid buildup.    ? Reduce certain blood toxins.    ? Reduce risk of bleeding from enlarged blood vessels in the stomach or esophagus (varices).       Liver transplant. In this procedure, a liver from a donor is used to replace your diseased liver. This is done if cirrhosis has caused liver failure.      Other treatments and procedures may be done depending on the problems that you get from cirrhosis. Common problems include liver-related kidney failure (hepatorenal syndrome).      Follow these instructions at home:       Take medicines only as told by your health care provider. Do not use  medicines that are toxic to your liver. Ask your health care  provider before taking any new medicines, including over-the-counter medicines.     Rest as needed.     Eat a well-balanced diet. Ask your health care provider or dietitian for more information.     Limit your salt or water intake, if your health care provider asks you to do this.     Do not drink alcohol. This is especially important if you are taking acetaminophen .     Keep all follow-up visits as told by your health care provider. This is important.      Contact a health care provider if you:     Have fatigue or weakness that is getting worse.     Develop swelling of the hands, feet, legs, or face.     Have a fever.     Develop loss of appetite.     Have nausea or vomiting.     Develop jaundice.     Develop easy bruising or bleeding.      Get help right away if you:     Vomit bright red blood or a material that looks like coffee grounds.     Have blood in your stools.     Notice that your stools appear black and tarry.     Become confused.     Have chest pain or trouble breathing.      Summary     Cirrhosis is chronic liver injury. Liver damage cannot be reversed. Common causes are hepatitis C and long-term alcohol abuse.     Tests used to diagnose cirrhosis include blood tests, imaging tests, and liver biopsy.     Treatment for this condition involves treating the underlying cause. Avoid alcohol, drugs, salt, and medicines that may damage your liver.     Contact your health care provider if you develop ascites, edema, jaundice, fever, nausea or vomiting, easy bruising or bleeding, or worsening fatigue.      This information is not intended to replace advice given to you by your health care provider. Make sure you discuss any questions you have with your health care provider.      Document Revised: 10/15/2018 Document Reviewed: 05/15/2017  Elsevier Patient Education ? 2021 Elsevier Inc.       ---------------------------------------------------------------------------------------------------------------------  [facility] allows patients to review your COVID and other test results as well as discharge documents from any [facility], Emergency Department, surgical center or outpatient lab. Test results are typically available 36 hours after the test is completed.     [facility] encourages you to self-enroll in the [facility] Patient Portal.     To begin your self-enrollment process, please visit [email or web address] Under [facility], click on "Sign up now".     NOTE: You must be 16 years and older to use [facility] Self-Enroll online. If you are a parent, caregiver, or guardian; you need an invite to access your child's or dependent's health records. To obtain an invite, contact the Medical Records department at [phone] Monday through Friday, 8-4:30, select option 3 . If we receive your call afterhours, we will return your call the next business day.     If you have issues trying to create or access your account, contact [employer] support at [phone] available 7 days a week 24 hours a day.     Comment:

## 2020-11-15 NOTE — ED Notes (Signed)
ED Patient Education Note     Patient Education Materials Follows:  Gastroenterology     Abdominal Pain, Adult    Pain in the abdomen (abdominal pain) can be caused by many things. Often, abdominal pain is not serious and it gets better with no treatment or by being treated at home. However, sometimes abdominal pain is serious.    Your health care provider will ask questions about your medical history and do a physical exam to try to determine the cause of your abdominal pain.      Follow these instructions at home:    Medicines     Take over-the-counter and prescription medicines only as told by your health care provider.     Do not take a laxative unless told by your health care provider.      General instructions       Watch your condition for any changes.     Drink enough fluid to keep your urine pale yellow.     Keep all follow-up visits as told by your health care provider. This is important.        Contact a health care provider if:     Your abdominal pain changes or gets worse.     You are not hungry or you lose weight without trying.     You are constipated or have diarrhea for more than 2?3 days.     You have pain when you urinate or have a bowel movement.     Your abdominal pain wakes you up at night.     Your pain gets worse with meals, after eating, or with certain foods.     You are vomiting and cannot keep anything down.     You have a fever.     You have blood in your urine.      Get help right away if:     Your pain does not go away as soon as your health care provider told you to expect.     You cannot stop vomiting.     Your pain is only in areas of the abdomen, such as the right side or the left lower portion of the abdomen. Pain on the right side could be caused by appendicitis.     You have bloody or black stools, or stools that look like tar.     You have severe pain, cramping, or bloating in your abdomen.     You have signs of dehydration, such as:  ? Dark urine, very little urine, or no  urine.    ? Cracked lips.    ? Dry mouth.    ? Sunken eyes.    ? Sleepiness.    ? Weakness.       You have trouble breathing or chest pain.      Summary     Often, abdominal pain is not serious and it gets better with no treatment or by being treated at home. However, sometimes abdominal pain is serious.     Watch your condition for any changes.     Take over-the-counter and prescription medicines only as told by your health care provider.     Contact a health care provider if your abdominal pain changes or gets worse.     Get help right away if you have severe pain, cramping, or bloating in your abdomen.      This information is not intended to replace advice given to you by your health care provider.   Make sure you discuss any questions you have with your health care provider.      Document Revised: 08/14/2019 Document Reviewed: 11/03/2018  Elsevier Patient Education ? 2021 [employer]         Cirrhosis      Cirrhosis is long-term (chronic) liver injury. The liver is the body's largest internal organ, and it performs many functions. It converts food into energy, removes toxic material from the blood, makes important proteins, and absorbs necessary vitamins from food.    In cirrhosis, healthy liver cells are replaced by scar tissue. This prevents blood from flowing through the liver, making it difficult for the liver to function. Scarring of the liver cannot be reversed, but treatment can prevent it from getting worse.      What are the causes?    Common causes of this condition are hepatitis C and long-term alcohol abuse. Other causes include:   Nonalcoholic fatty liver disease. This happens when fat is deposited in the liver by causes other than alcohol.     Hepatitis B infection.     Autoimmune hepatitis. In this condition, the body's defense system (immune system) mistakenly attacks the liver cells, causing irritation and swelling (inflammation).     Diseases that cause blockage of ducts inside the liver.      Inherited liver diseases, such as hemochromatosis. This is one of the most common inherited liver diseases. In this disease, deposits of iron collect in the liver and other organs.     Reactions to certain long-term medicines, such as amiodarone, a heart medicine.     Parasitic infections. These include schistosomiasis, which is caused by a flatworm.     Long-term contact to certain toxins. These toxins include certain organic solvents, such as toluene and chloroform.        What increases the risk?    You are more likely to develop this condition if:   You have certain types of viral hepatitis.     You abuse alcohol, especially if you are female.     You are overweight.     You share needles.     You have unprotected sex with someone who has viral hepatitis.        What are the signs or symptoms?    You may not have any signs and symptoms at first. Symptoms may not develop until the damage to your liver starts to get worse.    Early symptoms may include:   Weakness and tiredness (fatigue).     Changes in sleep patterns or having trouble sleeping.     Itchiness.     Tenderness in the right-upper part of your abdomen.     Weight loss and muscle loss.     Nausea.     Loss of appetite.     Appearance of tiny blood vessels under the skin.      Later symptoms may include:   Fatigue or weakness that is getting worse.     Yellow skin and eyes (jaundice).     Buildup of fluid in the abdomen (ascites). You may notice that your clothes are tight around your waist.     Weight gain.     Swelling of the feet and ankles (edema).     Trouble breathing.     Easy bruising and bleeding.     Vomiting blood.     Black or bloody stool.     Mental confusion.        How   is this diagnosed?    Your health care provider may suspect cirrhosis based on your symptoms and medical history, especially if you have other medical conditions or a history of alcohol abuse. Your health care provider will do a physical exam to feel your liver and to  check for signs of cirrhosis. He or she may perform other tests, including:   Blood tests to check:  ? For hepatitis B or C.    ? Kidney function.    ? Liver function.       Imaging tests such as:  ? MRI or CT scan to look for changes seen in advanced cirrhosis.    ? Ultrasound to see if normal liver tissue is being replaced by scar tissue.       A procedure in which a long needle is used to take a sample of liver tissue to be checked in a lab (biopsy). Liver biopsy can confirm the diagnosis of cirrhosis.        How is this treated?    Treatment for this condition depends on how damaged your liver is and what caused the damage. It may include treating the symptoms of cirrhosis, or treating the underlying causes in order to slow the damage. Treatment may include:   Making lifestyle changes, such as:  ? Eating a healthy diet. You may need to work with your health care provider or a diet and nutrition specialist (dietitian) to develop an eating plan.    ? Restricting salt intake.    ? Maintaining a healthy weight.    ? Not abusing drugs or alcohol.       Taking medicines to:  ? Treat liver infections or other infections.    ? Control itching.    ? Reduce fluid buildup.    ? Reduce certain blood toxins.    ? Reduce risk of bleeding from enlarged blood vessels in the stomach or esophagus (varices).       Liver transplant. In this procedure, a liver from a donor is used to replace your diseased liver. This is done if cirrhosis has caused liver failure.      Other treatments and procedures may be done depending on the problems that you get from cirrhosis. Common problems include liver-related kidney failure (hepatorenal syndrome).      Follow these instructions at home:       Take medicines only as told by your health care provider. Do not use medicines that are toxic to your liver. Ask your health care provider before taking any new medicines, including over-the-counter medicines.     Rest as needed.     Eat a  well-balanced diet. Ask your health care provider or dietitian for more information.     Limit your salt or water intake, if your health care provider asks you to do this.     Do not drink alcohol. This is especially important if you are taking acetaminophen.     Keep all follow-up visits as told by your health care provider. This is important.      Contact a health care provider if you:     Have fatigue or weakness that is getting worse.     Develop swelling of the hands, feet, legs, or face.     Have a fever.     Develop loss of appetite.     Have nausea or vomiting.     Develop jaundice.     Develop easy bruising or bleeding.  Get help right away if you:     Vomit bright red blood or a material that looks like coffee grounds.     Have blood in your stools.     Notice that your stools appear black and tarry.     Become confused.     Have chest pain or trouble breathing.      Summary     Cirrhosis is chronic liver injury. Liver damage cannot be reversed. Common causes are hepatitis C and long-term alcohol abuse.     Tests used to diagnose cirrhosis include blood tests, imaging tests, and liver biopsy.     Treatment for this condition involves treating the underlying cause. Avoid alcohol, drugs, salt, and medicines that may damage your liver.     Contact your health care provider if you develop ascites, edema, jaundice, fever, nausea or vomiting, easy bruising or bleeding, or worsening fatigue.      This information is not intended to replace advice given to you by your health care provider. Make sure you discuss any questions you have with your health care provider.      Document Revised: 10/15/2018 Document Reviewed: 05/15/2017  Elsevier Patient Education ? 2021 [employer]

## 2020-11-15 NOTE — ED Notes (Signed)
ED Triage Note       ED Triage Adult Entered On:  11/15/2020 18:19 EDT    Performed On:  11/15/2020 18:15 EDT by Colon Branch, RN, Tiffany A               Triage   Numeric Rating Pain Scale :   8   Chief Complaint :   abdominal pain off and on for 3 weeks, hurts more on right side vs left, no pain with uriantion, BM normal ittle nauseaous today,  pain worse everyday, takes HTN meds took them today    [place] Mode of Arrival :   Private vehicle   Infectious Disease Documentation :   Document assessment   Temperature Oral :   36.7 degC(Converted to: 98.1 degF)    Heart Rate Monitored :   74 bpm   Respiratory Rate :   18 br/min   Systolic Blood Pressure :   177 mmHg (HI)    Diastolic Blood Pressure :   109 mmHg (>HHI)    SpO2 :   99 %   Oxygen Therapy :   Room air   Patient presentation :   None of the above   Chief Complaint or Presentation suggest infection :   Yes   Dosing Weight Obtained By :   Patient stated   Weight Dosing :   67 kg(Converted to: 147 lb 11 oz)    Height :   157 cm(Converted to: 5 ft 2 in)    Body Mass Index Dosing :   27 kg/m2   Timlin, RN, Tiffany A - 11/15/2020 18:15 EDT   DCP GENERIC CODE   Tracking Acuity :   2   Tracking Group :   ED [employer]   [place], RN, Elmarie Shiley A - 11/15/2020 18:15 EDT   ED General Section :   Document assessment   Pregnancy Status :   N/A   ED Allergies Section :   Document assessment   ED Reason for Visit Section :   Document assessment   Timlin, RN, Tiffany A - 11/15/2020 18:15 EDT   ID Risk Screen Symptoms   Last 90 days COVID-19 ID :   No   Timlin, RN, Tiffany A - 11/15/2020 18:15 EDT   Allergies   (As Of: 11/15/2020 18:19:15 EDT)   Allergies (Active)   No Known Medication Allergies  Estimated Onset Date:   Unspecified ; Created ByColon Branch, RN, Tiffany A; Reaction Status:   Active ; Category:   Drug ; Substance:   No Known Medication Allergies ; Type:   Allergy ; Updated By:   Colon Branch, RN, Tiffany Mervyn Skeeters; Reviewed Date:   11/15/2020 18:17 EDT        Psycho-Social    Last 3 mo, thoughts killing self/others :   Patient denies   Right click within box for Suspected Abuse policy link. :   None   Feels Safe Where Live :   Yes   Timlin, RN, Tiffany A - 11/15/2020 18:15 EDT   ED Reason for Visit   (As Of: 11/15/2020 18:19:15 EDT)   Problems(Active)    Hypertension (SNOMED CT  :[id number] )  Name of Problem:   Hypertension ; Recorder:   Timlin, RN, Tiffany A; Confirmation:   Confirmed ; Classification:   Patient Stated ; Code:   [id number] ; Contributor System:   Dietitian ; Last Updated:   11/15/2020 18:18 EDT ; Life Cycle Date:   11/15/2020 ; Life Cycle  Status:   Active ; Vocabulary:   SNOMED CT          Diagnoses(Active)    Abdominal pain  Date:   11/15/2020 ; Diagnosis Type:   Reason For Visit ; Confirmation:   Complaint of ; Clinical Dx:   Abdominal pain ; Classification:   Medical ; Clinical Service:   Emergency medicine ; Code:   PNED ; Probability:   0 ; Diagnosis Code:   [id number]

## 2020-11-15 NOTE — Discharge Summary (Signed)
 ED Clinical Summary                         [facility]  [address]  [place], [place], [address]  [phone]           PERSON INFORMATION  Name: Melanie Sandoval, Melanie Sandoval Age:  65 Years DOB: [birth year]   Sex: Female Language: English PCP: DEVRA BARMAN   Marital Status:  Single Phone: [phone] Med Service: MED-Medicine   MRN:  [id number] Acct# [id number] Arrival: 11/15/2020 18:12:00   Visit Reason: Abdominal pain; ABD PAIN Acuity: 2 LOS: 000 03:19   Address:      [address] [place] [address]  Diagnosis:      Cirrhosis of liver; Right sided abdominal pain  Printed Prescriptions:            Allergies      No Known Medication Allergies      Medications Administered During Visit:                  Medication Dose Route   Sodium Chloride  0.9% 1000 mL IV Piggyback   iopamidol  100 mL IV Contrast   ketorolac 15 mg IV Push       Patient Medication List:              albuterol  (Albuterol  (Eqv-ProAir  HFA) 90 mcg/inh inhalation aerosol) 2 Puffs.  atorvastatin  (Lipitor  10 mg oral tablet) 1 Tabs Oral (given by mouth) every day.  atorvastatin  (Lipitor  10 mg oral tablet) Oral (given by mouth) every day.  dicyclomine (Bentyl 20 mg oral tablet) 1 Tabs Oral (given by mouth) 3 times a day as needed moderate pain (4-7) for 5 Days. Refills: 0.  fluticasone -salmeterol (fluticasone -salmeterol 250 mcg-50 mcg inhalation powder)  hydrOXYzine  (hydrOXYzine  hydrochloride 25 mg oral tablet)  levothyroxine  (Synthroid ) Oral (given by mouth) every day.  losartan  (losartan  50 mg oral tablet)  magnesium  aspartate Oral (given by mouth) 2 times a day.  montelukast  (montelukast  10 mg oral tablet)  montelukast  (Singulair  10 mg oral tablet) Oral (given by mouth) every day.         Major Tests and Procedures:  The following procedures and tests were performed during your ED visit.  COMMONPROCEDURES%>  COMMON PROCEDURESCOMMENTS%>          Laboratory Orders  Name Status Details   .UA POC Completed Urine, RT, RT -  Routine, Collected, 11/15/20 18:34:00 EDT, Nurse collect, 11/15/20 18:34:00 US Robinette, RAL POC Login   BMP Completed Blood, Stat, ST - Stat, 11/15/20 18:33:00 EDT, 11/15/20 18:33:00 EDT, Nurse collect, CLAUDENE COY GAILLARD-MD, Print label Y/N   CBCDIFF Completed Blood, Stat, ST - Stat, 11/15/20 18:33:00 EDT, 11/15/20 18:33:00 EDT, Nurse collect, CLAUDENE COY GAILLARD-MD, Print label Y/N   Hepatic Completed Blood, Stat, ST - Stat, 11/15/20 18:33:00 EDT, 11/15/20 18:33:00 EDT, Nurse collect, CLAUDENE COY GAILLARD-MD, Print label Y/N   Lactic Completed Blood, Stat, ST - Stat, 11/15/20 18:33:00 EDT, 11/15/20 18:33:00 EDT, Nurse collect, CLAUDENE COY GAILLARD-MD, Print label Y/N   Lipase Lvl Completed Blood, Stat, ST - Stat, 11/15/20 18:33:00 EDT, 11/15/20 18:33:00 EDT, Nurse collect, CLAUDENE COY [place], Print label Y/N               Radiology Orders  Name Status Details   CT Abdomen Pelvis w/ Contrast Completed 11/15/20 18:33:00 EDT, STAT 1 hour or less, Reason: Abdominal pain, acute, nonlocalized, Transport Mode: STRETCHER, pp_set_radiology_subspecialty  US  Abdomen Limited Completed 11/15/20 19:52:00 EDT, STAT 1 hour or less, Reason: Abdominal pain, right upper quadrant, Transport Mode: STRETCHER, pp_set_radiology_subspecialty               Patient Care Orders  Name Status Details   Discharge Patient Ordered 11/15/20 21:14:00 EDT   ED Assessment Adult Completed 11/15/20 18:19:16 EDT, 11/15/20 18:19:16 EDT   ED Secondary Triage Completed 11/15/20 18:19:16 EDT, 11/15/20 18:19:16 EDT   ED Triage Adult Completed 11/15/20 18:12:47 EDT, 11/15/20 18:12:47 EDT   POC-Urine Dipstick collect Completed 11/15/20 18:33:00 EDT, Once, 11/15/20 18:33:00 EDT   POC-Urine Dipstick collect Completed 11/15/20 18:33:00 EDT, Once, 11/15/20 18:33:00 EDT   Saline Lock Insert Completed 11/15/20 18:33:00 EDT, Once, 11/15/20 18:33:00 EDT             PROVIDER INFORMATION               Provider Role Assigned Sampson CLAUDENE COY  [facility] ED Provider 11/15/2020 18:26:26    Deeann Peel ED Nurse 11/15/2020 18:26:35    MELDA SOR E-PAC ED MidLevel 11/15/2020 18:26:54 11/15/2020 18:30:41       Attending Physician:  CLAUDENE COY GAILLARD-MD     Admit Doc  CLAUDENE COY GAILLARD-MD     Consulting Doc       VITALS INFORMATION  Vital Sign Triage Latest   Temp Oral ORAL_1%>36.7 degC ORAL%>36.7 degC   Temp Temporal TEMPORAL_1%> TEMPORAL%>   Temp Intravascular INTRAVASCULAR_1%> INTRAVASCULAR%>   Temp Axillary AXILLARY_1%> AXILLARY%>   Temp Rectal RECTAL_1%> RECTAL%>   02 Sat 99 % 90 %   Respiratory Rate RATE_1%>18 br/min RATE%>23 br/min   Peripheral Pulse Rate PULSE RATE_1%> PULSE RATE%>   Apical Heart Rate HEART RATE_1%> HEART RATE%>   Blood Pressure BLOOD PRESSURE_1%>/ BLOOD PRESSURE_1%>109 mmHg BLOOD PRESSURE%>155 mmHg / BLOOD PRESSURE%>112 mmHg                 Immunizations      No Immunizations Documented This Visit          DISCHARGE INFORMATION   Discharge Disposition: H Outpt-Sent Home   Discharge Location:    Home   Discharge Date and Time:    11/15/2020 21:31:55   ED Checkout Date and Time:    11/15/2020 21:31:55     DEPART REASON INCOMPLETE INFORMATION               Depart Action Incomplete Reason   Interactive View/I&O Recently assessed               Problems      No Problems Documented              Smoking Status      No Smoking Status Documented         PATIENT EDUCATION INFORMATION  Instructions:       Abdominal Pain, Adult; Cirrhosis     Follow up:                    With: Address: When:   [facility] Call for appt & office location   [phone] Business (1) Within 1 to 2 days   Comments:   Please return to the Emergency Department with any worsening or persistent symptoms. Please follow-up with recommended physician in the time frame listed. Please call with any problems or concerns. If you have no primary care doctor, please call [phone] to establish care with one. Thank you for choosing  [facility] ER!  ED PROVIDER DOCUMENTATION     Patient:   Melanie Sandoval, Melanie Sandoval            MRN: [id number]            FIN: [id number]               Age:   58 years     Sex:  Female     DOB:  [birth year]   Associated Diagnoses:   Right sided abdominal pain; Cirrhosis of liver   Author:   CLAUDENE COY GAILLARD-MD      Basic Information   Time seen: Provider Seen (ST)   ED Provider/Time:    CLAUDENE COY GAILLARD-MD / 11/15/2020 18:26  .   Additional information: Chief Complaint from Nursing Triage Note   Chief Complaint  Chief Complaint: abdominal pain off and on for 3 weeks, hurts more on right side vs left, no pain with uriantion, BM normal ittle nauseaous today,  pain worse everyday, takes HTN meds took them today (11/15/20 18:15:00).      History of Present Illness   This is a 65 year old female with a history of emphysema who presents for evaluation of right-sided abdominal pain.  This is been going on for about 3 weeks intermittently.  The pain is somewhat crampy.  Has not been associated with any fever, chills or vomiting.  Denies any dysuria, hematuria or acute change in bladder or bowel function.  She has tried some ibuprofen with only mild relief of pain.  The pain has been getting worse.  She presents with her daughter here for further evaluation.  Denies any history of abdominal surgery      Review of Systems   Gastrointestinal symptoms:  Abdominal pain.             Additional review of systems information: All other systems reviewed and otherwise negative.      Health Status   Allergies:    Allergic Reactions (Selected)  No Known Medication Allergies.   Medications:  (Selected)   Documented Medications  Documented  Albuterol  (Eqv-ProAir  HFA) 90 mcg/inh inhalation aerosol: 2 puffs, 0 Refill(s)  Lipitor  10 mg oral tablet: 10 mg, 1 tabs, Oral, Daily, 0 Refill(s)  Lipitor  10 mg oral tablet: mg, tabs, Oral, Daily, 0 Refill(s)  Singulair  10 mg oral tablet: mg, tabs, Oral, Daily, 0 Refill(s)  Synthroid : Oral,  Daily, 0 Refill(s)  fluticasone -salmeterol 250 mcg-50 mcg inhalation powder: 0 Refill(s)  hydrOXYzine  hydrochloride 25 mg oral tablet: 0 Refill(s)  losartan  50 mg oral tablet: 0 Refill(s)  magnesium  aspartate: mg, Oral, BID, 0 Refill(s)  montelukast  10 mg oral tablet: 0 Refill(s).      Past Medical/ Family/ Social History   Medical history: Reviewed as documented in chart.   Surgical history: Reviewed as documented in chart.   Family history: Not significant.   Social history: Reviewed as documented in chart.   Problem list:    Active Problems (3)  Emphysema lung   Hypertension   Hypothyroid   , per nurse's notes.      Physical Examination               Vital Signs   Vital Signs   11/15/2020 18:29 EDT Heart Rate Monitored 92 bpm    SpO2 90 %  LOW   11/15/2020 18:15 EDT Systolic Blood Pressure 177 mmHg  HI    Diastolic Blood Pressure 109 mmHg  >HHI    Temperature Oral 36.7 degC    Heart Rate  Monitored 74 bpm    Respiratory Rate 18 br/min    SpO2 99 %   .   Measurements   11/15/2020 18:19 EDT Body Mass Index est meas 27.18 kg/m2    Body Mass Index Measured 27.18 kg/m2   11/15/2020 18:15 EDT Height/Length Measured 157 cm    Weight Dosing 67 kg   .   Basic Oxygen Information   11/15/2020 18:29 EDT Oxygen Therapy Room air    SpO2 90 %  LOW   11/15/2020 18:15 EDT Oxygen Therapy Room air    SpO2 99 %   .   General:  Alert, no acute distress.    Skin:  Warm, dry, pink, intact.    Head:  Normocephalic, atraumatic.    Neck:  Supple, trachea midline, no tenderness.    Eye:  Pupils are equal, round and reactive to light, extraocular movements are intact, normal conjunctiva.    Ears, nose, mouth and throat:  Oral mucosa moist.   Cardiovascular:  Regular rate and rhythm, No murmur, Normal peripheral perfusion, No edema.    Respiratory:  Lungs are clear to auscultation, respirations are non-labored, breath sounds are equal, Symmetrical chest wall expansion.    Chest wall:  No tenderness.   Back:  Nontender, Normal range of motion.     Musculoskeletal:  Normal ROM, normal strength, no swelling.    Gastrointestinal:  Soft, Non distended, Tenderness: Moderate, right flank, right upper quadrant, right lower quadrant, Guarding: Negative, Rebound: Negative.    Neurological:  Alert and oriented to person, place, time, and situation, No focal neurological deficit observed.    Lymphatics:  No lymphadenopathy.      Medical Decision Making   Differential Diagnosis:  Appendicitis, bowel obstruction, ureteral stone, biliary colic, cholecystitis, hepatitis, pancreatitis, abdominal aortic aneurysm, ischemic bowel, diverticulitis, constipation.    Documents reviewed:  Emergency department nurses' notes, emergency department records, prior records.    Electrocardiogram:  Emergency Provider interpretation performed by me, See ECG ED Review.    Results review:  Lab results : Lab View   11/15/2020 19:05 EDT Estimated Creatinine Clearance 71.59 mL/min   11/15/2020 18:34 EDT WBC 6.9 x10e3/mcL    RBC 4.20 x10e6/mcL    Hgb 13.8 g/dL    HCT 60.4 %    MCV 05.9 fL    MCH 32.9 pg    MCHC 34.9 g/dL    RDW 87.4 %    Platelet 162 x10e3/mcL    MPV 9.4 fL    Neutro Auto 60.1 %    Neutro Absolute 4.2 x10e3/mcL    Immature Grans Percent 0.3 %    Immature Grans Absolute 0.02 x10e3/mcL    Lymph Auto 30.8 %    Lymph Absolute 2.1 x10e3/mcL    Mono Auto 6.5 %    Mono Absolute 0.5 x10e3/mcL    Eosinophil Percent 1.7 %    Eos Absolute 0.1 x10e3/mcL    Basophil Auto 0.6 %    Baso Absolute 0.0 x10e3/mcL    Sodium Lvl 136 mmol/L    Potassium Lvl 4.1 mmol/L    Chloride 102 mmol/L    CO2 23 mmol/L    Glucose Random 94 mg/dL    BUN 16 mg/dL    Creatinine Lvl 0.7 mg/dL    AGAP 12 mmol/L    Osmolality Calc 273 mOsm/kg    Calcium  Lvl 9.6 mg/dL    Protein Total 7.6 g/dL    Albumin Lvl 4.7 g/dL    Alk Phos 95 unit/L    AST  30 unit/L    ALT 16 unit/L    eGFR AA 105 mL/min/1.64m    eGFR Non-AA 91 mL/min/1.51m    Bili Total 0.54 mg/dL    Bili Direct <9.79 mg/dL    Lipase Lvl 33 unit/L    Lactic  Acid Lvl 0.6 mmol/L    Appear U POC Clear    Color U POC Yellow    Bili U POC Negative    Blood U POC Trace    Glucose U POC Negative mg/dL    Ketones U POC Negative mg/dL    Leuk Est U POC Negative    Nitrite U POC Negative    pH U POC 6.5    Protein U POC Negative    Spec Grav U POC 1.015    Urobilin U POC 0.2 EU/dL   4/89/7977 81:80 EDT Estimated Creatinine Clearance 62.64 mL/min   .   Radiology results:  Rad Results (ST)   US  Abdomen Limited  ?  11/15/20 21:06:00  Right upper quadrant abdominal ultrasound: 11/15/20    INDICATION:Abdominal pain, right upper quadrant.    COMPARISON: CT abdomen pelvis 11/15/2020    TECHNIQUE: Multiple gray-scale and color Doppler images of the right upper  quadrant.    FINDINGS:  Liver: 14.2 cm. Heterogeneous echotexture. Visualized main portal vein is  patent.  Common hepatic duct: 2.9 mm  Common bile duct: 5 mm  Gallbladder: No calculi or sludge. No wall thickening. Sonographic Beverley sign  is absent.  Pancreas: Visualized portions of the pancreas are unremarkable.  Right kidney: No hydronephrosis. 10.9 cm.  Other: No free fluid.    IMPRESSION:  No evidence of acute cholecystitis.    Nonspecific heterogeneity of the liver, can be seen in the setting of chronic  liver disease. Consider correlation with liver function tests.  ?  Signed By: ROSINA PRIDE E-MD  ?  **************************************************  CT Abdomen Pelvis w/ Contrast  ?  11/15/20 19:40:10  CT ABDOMEN AND PELVIS WITH IV CONTRAST: 11/15/2020 7:25 PM    INDICATION: Abdominal pain, acute, nonlocalized    TECHNIQUE: Images were obtained from diaphragm to symphysis pubis with IV  contrast without adverse reaction. CT scanning was performed using radiation  dose reduction techniques when appropriate, per system protocol.    COMPARISON: None      FINDINGS: Significant reticular densities are present in the lung bases along  with some bronchiectasis. At least moderate coronary artery calcification. Small  hiatal  hernia.    Surface of the liver appears somewhat nodular. Liver otherwise unremarkable.  Spleen borderline enlarged at 12 cm in craniocaudal length. No focal lesion in  the spleen. No varices. Pancreas and adrenals are unremarkable. Kidneys are  unremarkable.    Urinary bladder is unremarkable. Uterus is unremarkable. No adnexal mass.    Appendix is unremarkable. No dilated bowel or bowel wall thickening. No  diverticulitis. No abnormal fluid in large or small bowel. No abnormality of  stomach appreciated.    Gallbladder appears unremarkable. No adenopathy. No ascites. No bone lesion  appreciated. No aortic aneurysm.        IMPRESSION:      No acute intra-abdominal process appreciated.    Small hiatal hernia.    Pulmonary fibrosis in the lung bases, only partially included on study.    Findings suggesting cirrhosis and possibly portal hypertension.  ?  Signed By: AGAPITO AGENT JR-MD  .      Reexamination/ Reevaluation   8:30 PM: At this point the patient's work-up  is largely unremarkable.  She still uncomfortable but did not want the morphine initially.  I will give IV Toradol, which she is willing to try.  Currently awaiting right upper quadrant ultrasound to be performed.    9:12 PM: At this point the patient's work-up is largely unremarkable.  She does have some findings of cirrhosis.  Does report alcohol intake in the past.  She has recently moved here so I think establishing follow-up with GI would be prudent.  She has had some somewhat crampy abdominal pain I will put her on a trial of Bentyl.  I see no evidence for obvious biliary disease on imaging.  We will plan to follow-up with [facility] in the next few days      Impression and Plan   Diagnosis   Right sided abdominal pain (ICD10-CM R10.9, Discharge, Medical)   Cirrhosis of liver (ICD10-CM K74.60, Discharge, Medical)   Plan   Condition: Improved.    Disposition: Medically cleared, Discharged: Time  11/15/2020 21:13:00, to home.    Prescriptions: Launch  prescriptions   Pharmacy:  Bentyl 20 mg oral tablet (Prescribe): 20 mg, 1 tabs, Oral, TID, for 5 days, PRN: moderate pain (4-7), 15 tabs, 0 Refill(s).    Patient was given the following educational materials: Cirrhosis, Abdominal Pain, Adult.    Follow up with: [facility] Within 1 to 2 days Please return to the Emergency Department with any worsening or persistent symptoms. Please follow-up with recommended physician in the time frame listed. Please call with any problems or concerns. If you have no primary care doctor, please call [phone] to establish care with one. Thank you for choosing [facility] ER!  SABRA    Counseled: I had a detailed discussion with the patient and/or guardian regarding the historical points/exam findings supporting the discharge diagnosis and need for outpatient followup. Discussed the need to return to the ER if symptoms persist/worsen, or for any questions/concerns that arise at home.

## 2020-11-15 NOTE — ED Provider Notes (Signed)
Abdominal pain        Patient:   Melanie Sandoval, Melanie Sandoval            MRN: [id number]            FIN: [id number]               Age:   65 years     Sex:  Female     DOB:  1955-07-13   Associated Diagnoses:   Right sided abdominal pain; Cirrhosis of liver   Author:   Marnee Guarneri GAILLARD-MD      Basic Information   Time seen: Provider Seen (ST)   ED Provider/Time:    Marnee Guarneri GAILLARD-MD / 11/15/2020 18:26  .   Additional information: Chief Complaint from Nursing Triage Note   Chief Complaint  Chief Complaint: abdominal pain off and on for 3 weeks, hurts more on right side vs left, no pain with uriantion, BM normal ittle nauseaous today,  pain worse everyday, takes HTN meds took them today (11/15/20 18:15:00).      History of Present Illness   This is a 65 year old female with a history of emphysema who presents for evaluation of right-sided abdominal pain.  This is been going on for about 3 weeks intermittently.  The pain is somewhat crampy.  Has not been associated with any fever, chills or vomiting.  Denies any dysuria, hematuria or acute change in bladder or bowel function.  She has tried some ibuprofen with only mild relief of pain.  The pain has been getting worse.  She presents with her daughter here for further evaluation.  Denies any history of abdominal surgery      Review of Systems   Gastrointestinal symptoms:  Abdominal pain.             Additional review of systems information: All other systems reviewed and otherwise negative.      Health Status   Allergies:    Allergic Reactions (Selected)  No Known Medication Allergies.   Medications:  (Selected)   Documented Medications  Documented  Albuterol (Eqv-ProAir HFA) 90 mcg/inh inhalation aerosol: 2 puffs, 0 Refill(s)  Lipitor 10 mg oral tablet: 10 mg, 1 tabs, Oral, Daily, 0 Refill(s)  Lipitor 10 mg oral tablet: mg, tabs, Oral, Daily, 0 Refill(s)  Singulair 10 mg oral tablet: mg, tabs, Oral, Daily, 0 Refill(s)  Synthroid: Oral, Daily, 0  Refill(s)  fluticasone-salmeterol 250 mcg-50 mcg inhalation powder: 0 Refill(s)  hydrOXYzine hydrochloride 25 mg oral tablet: 0 Refill(s)  losartan 50 mg oral tablet: 0 Refill(s)  magnesium aspartate: mg, Oral, BID, 0 Refill(s)  montelukast 10 mg oral tablet: 0 Refill(s).      Past Medical/ Family/ Social History   Medical history: Reviewed as documented in chart.   Surgical history: Reviewed as documented in chart.   Family history: Not significant.   Social history: Reviewed as documented in chart.   Problem list:    Active Problems (3)  Emphysema lung   Hypertension   Hypothyroid   , per nurse's notes.      Physical Examination               Vital Signs   Vital Signs   11/15/2020 18:29 EDT Heart Rate Monitored 92 bpm    SpO2 90 %  LOW   4/78/2956 21:30 EDT Systolic Blood Pressure 865 mmHg  HI    Diastolic Blood Pressure 784 mmHg  >HHI    Temperature Oral 36.7 degC  Heart Rate Monitored 74 bpm    Respiratory Rate 18 br/min    SpO2 99 %   .   Measurements   11/15/2020 18:19 EDT Body Mass Index est meas 27.18 kg/m2    Body Mass Index Measured 27.18 kg/m2   11/15/2020 18:15 EDT Height/Length Measured 157 cm    Weight Dosing 67 kg   .   Basic Oxygen Information   11/15/2020 18:29 EDT Oxygen Therapy Room air    SpO2 90 %  LOW   11/15/2020 18:15 EDT Oxygen Therapy Room air    SpO2 99 %   .   General:  Alert, no acute distress.    Skin:  Warm, dry, pink, intact.    Head:  Normocephalic, atraumatic.    Neck:  Supple, trachea midline, no tenderness.    Eye:  Pupils are equal, round and reactive to light, extraocular movements are intact, normal conjunctiva.    Ears, nose, mouth and throat:  Oral mucosa moist.   Cardiovascular:  Regular rate and rhythm, No murmur, Normal peripheral perfusion, No edema.    Respiratory:  Lungs are clear to auscultation, respirations are non-labored, breath sounds are equal, Symmetrical chest wall expansion.    Chest wall:  No tenderness.   Back:  Nontender, Normal range of motion.     Musculoskeletal:  Normal ROM, normal strength, no swelling.    Gastrointestinal:  Soft, Non distended, Tenderness: Moderate, right flank, right upper quadrant, right lower quadrant, Guarding: Negative, Rebound: Negative.    Neurological:  Alert and oriented to person, place, time, and situation, No focal neurological deficit observed.    Lymphatics:  No lymphadenopathy.      Medical Decision Making   Differential Diagnosis:  Appendicitis, bowel obstruction, ureteral stone, biliary colic, cholecystitis, hepatitis, pancreatitis, abdominal aortic aneurysm, ischemic bowel, diverticulitis, constipation.    Documents reviewed:  Emergency department nurses' notes, emergency department records, prior records.    Electrocardiogram:  Emergency Provider interpretation performed by me, See ECG ED Review.    Results review:  Lab results : Lab View   11/15/2020 19:05 EDT Estimated Creatinine Clearance 71.59 mL/min   11/15/2020 18:34 EDT WBC 6.9 x10e3/mcL    RBC 4.20 x10e6/mcL    Hgb 13.8 g/dL    HCT 39.5 %    MCV 94.0 fL    MCH 32.9 pg    MCHC 34.9 g/dL    RDW 12.5 %    Platelet 162 x10e3/mcL    MPV 9.4 fL    Neutro Auto 60.1 %    Neutro Absolute 4.2 x10e3/mcL    Immature Grans Percent 0.3 %    Immature Grans Absolute 0.02 x10e3/mcL    Lymph Auto 30.8 %    Lymph Absolute 2.1 x10e3/mcL    Mono Auto 6.5 %    Mono Absolute 0.5 x10e3/mcL    Eosinophil Percent 1.7 %    Eos Absolute 0.1 x10e3/mcL    Basophil Auto 0.6 %    Baso Absolute 0.0 x10e3/mcL    Sodium Lvl 136 mmol/L    Potassium Lvl 4.1 mmol/L    Chloride 102 mmol/L    CO2 23 mmol/L    Glucose Random 94 mg/dL    BUN 16 mg/dL    Creatinine Lvl 0.7 mg/dL    AGAP 12 mmol/L    Osmolality Calc 273 mOsm/kg    Calcium Lvl 9.6 mg/dL    Protein Total 7.6 g/dL    Albumin Lvl 4.7 g/dL    Alk Phos 95 unit/L  AST 30 unit/L    ALT 16 unit/L    eGFR AA 105 mL/min/1.25m???    eGFR Non-AA 91 mL/min/1.742m??    Bili Total 0.54 mg/dL    Bili Direct <0.20 mg/dL    Lipase Lvl 33 unit/L    Lactic  Acid Lvl 0.6 mmol/L    Appear U POC Clear    Color U POC Yellow    Bili U POC Negative    Blood U POC Trace    Glucose U POC Negative mg/dL    Ketones U POC Negative mg/dL    Leuk Est U POC Negative    Nitrite U POC Negative    pH U POC 6.5    Protein U POC Negative    Spec Grav U POC 1.015    Urobilin U POC 0.2 EU/dL   11/15/2020 18:19 EDT Estimated Creatinine Clearance 62.64 mL/min   .   Radiology results:  Rad Results (ST)   USKoreabdomen Limited  ?  11/15/20 21:06:00  Right upper quadrant abdominal ultrasound: 11/15/20    INDICATION:Abdominal pain, right upper quadrant.    COMPARISON: CT abdomen pelvis 11/15/2020    TECHNIQUE: Multiple gray-scale and color Doppler images of the right upper  quadrant.    FINDINGS:  Liver: 14.2 cm. Heterogeneous echotexture. Visualized main portal vein is  patent.  Common hepatic duct: 2.9 mm  Common bile duct: 5 mm  Gallbladder: No calculi or sludge. No wall thickening. Sonographic MuPercell Millerign  is absent.  Pancreas: Visualized portions of the pancreas are unremarkable.  Right kidney: No hydronephrosis. 10.9 cm.  Other: No free fluid.    IMPRESSION:  No evidence of acute cholecystitis.    Nonspecific heterogeneity of the liver, can be seen in the setting of chronic  liver disease. Consider correlation with liver function tests.  ?  Signed By: ASFerol Luz-MD  ?  **************************************************  CT Abdomen Pelvis w/ Contrast  ?  11/15/20 19:40:10  CT ABDOMEN AND PELVIS WITH IV CONTRAST: 11/15/2020 7:25 PM    INDICATION: Abdominal pain, acute, nonlocalized    TECHNIQUE: Images were obtained from diaphragm to symphysis pubis with IV  contrast without adverse reaction. CT scanning was performed using radiation  dose reduction techniques when appropriate, per system protocol.    COMPARISON: None      FINDINGS: Significant reticular densities are present in the lung bases along  with some bronchiectasis. At least moderate coronary artery calcification. Small  hiatal  hernia.    Surface of the liver appears somewhat nodular. Liver otherwise unremarkable.  Spleen borderline enlarged at 12 cm in craniocaudal length. No focal lesion in  the spleen. No varices. Pancreas and adrenals are unremarkable. Kidneys are  unremarkable.    Urinary bladder is unremarkable. Uterus is unremarkable. No adnexal mass.    Appendix is unremarkable. No dilated bowel or bowel wall thickening. No  diverticulitis. No abnormal fluid in large or small bowel. No abnormality of  stomach appreciated.    Gallbladder appears unremarkable. No adenopathy. No ascites. No bone lesion  appreciated. No aortic aneurysm.        IMPRESSION:      No acute intra-abdominal process appreciated.    Small hiatal hernia.    Pulmonary fibrosis in the lung bases, only partially included on study.    Findings suggesting cirrhosis and possibly portal hypertension.  ?  Signed By: MEMichelene GardenerR-MD  .      Reexamination/ Reevaluation   8:30 PM: At this point the patient's  work-up is largely unremarkable.  She still uncomfortable but did not want the morphine initially.  I will give IV Toradol, which she is willing to try.  Currently awaiting right upper quadrant ultrasound to be performed.    9:12 PM: At this point the patient's work-up is largely unremarkable.  She does have some findings of cirrhosis.  Does report alcohol intake in the past.  She has recently moved here so I think establishing follow-up with GI would be prudent.  She has had some somewhat crampy abdominal pain I will put her on a trial of Bentyl.  I see no evidence for obvious biliary disease on imaging.  We will plan to follow-up with [facility] in the next few days      Impression and Plan   Diagnosis   Right sided abdominal pain (ICD10-CM R10.9, Discharge, Medical)   Cirrhosis of liver (ICD10-CM K74.60, Discharge, Medical)   Plan   Condition: Improved.    Disposition: Medically cleared, Discharged: Time  11/15/2020 21:13:00, to home.    Prescriptions: Launch  prescriptions   Pharmacy:  Bentyl 20 mg oral tablet (Prescribe): 20 mg, 1 tabs, Oral, TID, for 5 days, PRN: moderate pain (4-7), 15 tabs, 0 Refill(s).    Patient was given the following educational materials: Cirrhosis, Abdominal Pain, Adult.    Follow up with: [facility] Within 1 to 2 days Please return to the Emergency Department with any worsening or persistent symptoms. Please follow-up with recommended physician in the time frame listed. Please call with any problems or concerns. If you have no primary care doctor, please call [phone] to establish care with one. Thank you for choosing [facility]!  Marland Kitchen    Counseled: I had a detailed discussion with the patient and/or guardian regarding the historical points/exam findings supporting the discharge diagnosis and need for outpatient followup. Discussed the need to return to the ER if symptoms persist/worsen, or for any questions/concerns that arise at home.    Signature Line     Electronically Signed on 11/15/2020 09:14 PM EDT   ________________________________________________   Marnee Guarneri GAILLARD-MD               Modified by: Marnee Guarneri GAILLARD-MD on 11/15/2020 09:14 PM EDT

## 2020-11-15 NOTE — ED Notes (Signed)
ED Triage Note       ED Secondary Triage Entered On:  11/15/2020 18:51 EDT    Performed On:  11/15/2020 18:45 EDT by Avel Peace               General Information   Barriers to Learning :   None evident   Last Tetanus :   Less than 5 years   Immunizations Current :   Yes   Influenza Vaccine Status :   Received prior to admission, during current flu season   COVID-19 Vaccine Status :   2 Doses received   ED Home Meds Section :   Document assessment   [facility] ED Fall Risk Section :   Document assessment   ED Advance Directives Section :   Document assessment   ED Palliative Screen :   Document assessment   Avel Peace - 11/15/2020 18:45 EDT   (As Of: 11/15/2020 18:51:09 EDT)   Problems(Active)    Emphysema lung (SNOMED CT  :324401027 )  Name of Problem:   Emphysema lung ; Recorder:   Lyttle,  Hannah-RN; Confirmation:   Confirmed ; Classification:   Patient Stated ; Code:   [id number] ; Contributor System:   Dietitian ; Last Updated:   11/15/2020 18:50 EDT ; Life Cycle Date:   11/15/2020 ; Life Cycle Status:   Active ; Vocabulary:   SNOMED CT        Hypertension (SNOMED CT  :4742595638 )  Name of Problem:   Hypertension ; Recorder:   Timlin, RN, Tiffany A; Confirmation:   Confirmed ; Classification:   Patient Stated ; Code:   [id number] ; Contributor System:   Dietitian ; Last Updated:   11/15/2020 18:18 EDT ; Life Cycle Date:   11/15/2020 ; Life Cycle Status:   Active ; Vocabulary:   SNOMED CT        Hypothyroid (SNOMED CT  :88416606 )  Name of Problem:   Hypothyroid ; Recorder:   Avel Peace; Confirmation:   Confirmed ; Classification:   Patient Stated ; Code:   [id number] ; Contributor System:   Dietitian ; Last Updated:   11/15/2020 18:50 EDT ; Life Cycle Date:   11/15/2020 ; Life Cycle Status:   Active ; Vocabulary:   SNOMED CT          Diagnoses(Active)    Abdominal pain  Date:   11/15/2020 ; Diagnosis Type:   Reason For Visit ; Confirmation:   Complaint of ; Clinical Dx:   Abdominal pain ;  Classification:   Medical ; Clinical Service:   Emergency medicine ; Code:   PNED ; Probability:   0 ; Diagnosis Code:   [id number]             -    Procedure History   (As Of: 11/15/2020 18:51:09 EDT)     Anesthesia Minutes:   0 ; Procedure Name:   Thyroidectomy ; Procedure Minutes:   0 ; Last Reviewed Dt/Tm:   11/15/2020 18:51:03 EDT            [facility] Fall Risk Assessment Tool   Hx of falling last 3 months ED Fall :   No   Patient confused or disoriented ED Fall :   No   Patient intoxicated or sedated ED Fall :   No   Patient impaired gait ED Fall :   No   Use a mobility assistance device ED Fall :   No   Patient altered  elimination ED Fall :   No   [facility] ED Fall Score :   0    Avel Peace - 11/15/2020 18:45 EDT   ED Advance Directive   Advance Directive :   No   Lyttle,  Hannah-RN - 11/15/2020 18:45 EDT   Palliative Care   Does the Patient have a Life Limiting Illness :   None of the above   Lyttle,  Hannah-RN - 11/15/2020 18:45 EDT   Med Hx   Medication List   (As Of: 11/15/2020 18:51:09 EDT)   Normal Order    iopamidol 61% Inj Soln 100 mL  :   iopamidol 61% Inj Soln 100 mL ; Status:   Ordered ; Ordered As Mnemonic:   Isovue-300 ; Simple Display Line:   100 mL, IV Contrast, Once ; Ordering Provider:   Bruce Donath; Catalog Code:   iopamidol ; Order Dt/Tm:   11/15/2020 18:33:11 EDT          morphine 4 mg/mL preservative-free Sol 1 mL  :   morphine 4 mg/mL preservative-free Sol 1 mL ; Status:   Completed ; Ordered As Mnemonic:   morphine ; Simple Display Line:   4 mg, 1 mL, IV Push, Once ; Ordering Provider:   Bruce Donath; Catalog Code:   morphine ; Order Dt/Tm:   11/15/2020 18:33:10 EDT          ondansetron 2 mg/mL Inj Soln 2 mL  :   ondansetron 2 mg/mL Inj Soln 2 mL ; Status:   Completed ; Ordered As Mnemonic:   Zofran ; Simple Display Line:   4 mg, 2 mL, IV Push, Once ; Ordering Provider:   Bruce Donath; Catalog Code:   ondansetron ; Order  Dt/Tm:   11/15/2020 18:33:10 EDT          Sodium Chloride 0.9% intravenous solution Bolus  :   Sodium Chloride 0.9% intravenous solution Bolus ; Status:   Completed ; Ordered As Mnemonic:   Sodium Chloride 0.9% bolus ; Simple Display Line:   1,000 mL, 1000 mL/hr, IV Piggyback, Once ; Ordering Provider:   Bruce Donath; Catalog Code:   Sodium Chloride 0.9% ; Order Dt/Tm:   11/15/2020 18:33:10 EDT            Home Meds    atorvastatin  :   atorvastatin ; Status:   Documented ; Ordered As Mnemonic:   Lipitor 10 mg oral tablet ; Simple Display Line:   10 mg, 1 tabs, Oral, Daily, 0 Refill(s) ; Catalog Code:   atorvastatin ; Order Dt/Tm:   11/15/2020 18:48:38 EDT          levothyroxine  :   levothyroxine ; Status:   Documented ; Ordered As Mnemonic:   Synthroid ; Simple Display Line:   Oral, Daily, 0 Refill(s) ; Catalog Code:   levothyroxine ; Order Dt/Tm:   11/15/2020 18:48:14 EDT          magnesium aspartate  :   magnesium aspartate ; Status:   Documented ; Ordered As Mnemonic:   magnesium aspartate ; Simple Display Line:   mg, Oral, BID, 0 Refill(s) ; Catalog Code:   magnesium aspartate ; Order Dt/Tm:   11/15/2020 18:48:05 EDT          atorvastatin  :   atorvastatin ; Status:   Documented ; Ordered As Mnemonic:   Lipitor 10 mg oral tablet ; Simple Display Line:   mg, tabs,  Oral, Daily, 0 Refill(s) ; Catalog Code:   atorvastatin ; Order Dt/Tm:   11/15/2020 18:47:32 EDT          hydrOXYzine  :   hydrOXYzine ; Status:   Documented ; Ordered As Mnemonic:   hydrOXYzine hydrochloride 25 mg oral tablet ; Simple Display Line:   0 Refill(s) ; Catalog Code:   hydrOXYzine ; Order Dt/Tm:   11/15/2020 18:48:52 EDT          losartan  :   losartan ; Status:   Documented ; Ordered As Mnemonic:   losartan 50 mg oral tablet ; Simple Display Line:   0 Refill(s) ; Catalog Code:   losartan ; Order Dt/Tm:   11/15/2020 18:48:52 EDT          montelukast  :   montelukast ; Status:   Documented ; Ordered As Mnemonic:   Singulair 10 mg oral  tablet ; Simple Display Line:   mg, tabs, Oral, Daily, 0 Refill(s) ; Catalog Code:   montelukast ; Order Dt/Tm:   11/15/2020 18:47:44 EDT          albuterol  :   albuterol ; Status:   Documented ; Ordered As Mnemonic:   Albuterol (Eqv-ProAir HFA) 90 mcg/inh inhalation aerosol ; Simple Display Line:   2 puffs, 0 Refill(s) ; Catalog Code:   albuterol ; Order Dt/Tm:   11/15/2020 18:48:52 EDT          fluticasone-salmeterol  :   fluticasone-salmeterol ; Status:   Documented ; Ordered As Mnemonic:   fluticasone-salmeterol 250 mcg-50 mcg inhalation powder ; Simple Display Line:   0 Refill(s) ; Catalog Code:   fluticasone-salmeterol ; Order Dt/Tm:   11/15/2020 18:48:52 EDT          montelukast  :   montelukast ; Status:   Documented ; Ordered As Mnemonic:   montelukast 10 mg oral tablet ; Simple Display Line:   0 Refill(s) ; Catalog Code:   montelukast ; Order Dt/Tm:   11/15/2020 18:48:52 EDT

## 2020-11-16 ENCOUNTER — Other Ambulatory Visit: Payer: Self-pay | Admitting: Family Medicine

## 2020-11-16 NOTE — Telephone Encounter (Signed)
Pharmacy requests refill on: Montelukast 10 mg   LAST REFILL: 04/11/2020 (Q-90, R-0) LAST OV: 04/11/2020  NEXT OV:Not Scheduled  PHARMACY: [id number] [place], [place] and confirmed with patient that she did need refill sent to this pharmacy.

## 2020-11-19 LAB — VITAMIN D 25 HYDROXY: Vit D, 25-Hydroxy: 24.7 ng/mL — ABNORMAL LOW (ref 30.0–90.0)

## 2021-02-09 MED ORDER — LEVOTHYROXINE SODIUM 88 MCG PO TABS
88 MCG | ORAL_TABLET | ORAL | 0 refills | Status: DC
Start: 2021-02-09 — End: 2021-03-16

## 2021-02-16 LAB — BASIC METABOLIC PANEL
Anion Gap: 10 mmol/L (ref 2–17)
BUN: 13 mg/dL (ref 8–23)
CO2: 24 mmol/L (ref 22–29)
Calcium: 9.8 mg/dL (ref 8.8–10.2)
Chloride: 103 mmol/L (ref 98–107)
Creatinine: 0.9 mg/dL (ref 0.5–1.0)
Est, Glom Filt Rate: 71 mL/min/1.73m?? — ABNORMAL LOW (ref >=90)
Glucose: 70 mg/dL (ref 70–99)
OSMOLALITY CALCULATED: 272 mOsm/kg (ref 270–287)
Potassium: 4.6 mmol/L (ref 3.5–5.3)
Sodium: 137 mmol/L (ref 135–145)

## 2021-02-16 LAB — URINALYSIS-MACROSCOPIC
Bilirubin Urine: NEGATIVE
Blood, Urine: NEGATIVE
Glucose, UA: NEGATIVE
Ketones, Urine: NEGATIVE
Leukocyte Esterase, Urine: NEGATIVE
Nitrite, Urine: NEGATIVE
Protein, UA: NEGATIVE
Specific Gravity, UA: <=1.005 (ref 1.003–1.035)
Urobilinogen, Urine: 0.2 EU/dL
pH, UA: 6 (ref 4.5–8.0)

## 2021-02-16 LAB — TSH: TSH, 3RD GENERATION: 0.932 mcIU/mL (ref 0.358–3.740)

## 2021-02-17 LAB — LIPID PANEL
Chol/HDL Ratio: 3.2 (ref 0.0–4.4)
Cholesterol: 145 mg/dL (ref 100–200)
HDL: 46 mg/dL — ABNORMAL LOW (ref >=50)
LDL Cholesterol: 63 mg/dL (ref 0.0–100.0)
LDL/HDL Ratio: 1.4
Triglycerides: 178 mg/dL — ABNORMAL HIGH (ref 0–149)
VLDL: 35.6 mg/dL (ref 5.0–40.0)

## 2021-02-17 LAB — VITAMIN D 25 HYDROXY: Vit D, 25-Hydroxy: 35.9 ng/mL (ref 30.0–90.0)

## 2021-02-17 LAB — T3, FREE: T3, Free: 2.26 pg/mL (ref 2.00–4.40)

## 2021-02-17 LAB — T4, FREE: T4 Free: 1.64 ng/dL (ref 0.82–1.70)

## 2021-03-16 MED ORDER — LEVOTHYROXINE SODIUM 88 MCG PO TABS
88 MCG | ORAL_TABLET | ORAL | 0 refills | Status: DC
Start: 2021-03-16 — End: 2021-05-29

## 2021-03-16 MED ORDER — MONTELUKAST SODIUM 10 MG PO TABS
10 MG | ORAL_TABLET | Freq: Every evening | ORAL | 0 refills | Status: DC
Start: 2021-03-16 — End: 2021-05-19

## 2021-05-14 ENCOUNTER — Emergency Department: Admit: 2021-05-14 | Payer: MEDICARE | Primary: Family Medicine

## 2021-05-14 ENCOUNTER — Inpatient Hospital Stay: Admit: 2021-05-15 | Payer: MEDICARE | Primary: Family Medicine

## 2021-05-14 ENCOUNTER — Ambulatory Visit: Admit: 2021-05-14 | Discharge: 2021-05-14 | Payer: MEDICARE | Attending: Family | Primary: Family Medicine

## 2021-05-14 ENCOUNTER — Inpatient Hospital Stay
Admission: EM | Admit: 2021-05-14 | Discharge: 2021-05-20 | Disposition: A | Discharge status: Home / Self Care | Payer: MEDICARE | Admitting: Internal Medicine

## 2021-05-14 DIAGNOSIS — J9601 Acute respiratory failure with hypoxia: Secondary | ICD-10-CM

## 2021-05-14 DIAGNOSIS — J441 Chronic obstructive pulmonary disease with (acute) exacerbation: Secondary | ICD-10-CM

## 2021-05-14 LAB — COMPREHENSIVE METABOLIC PANEL
ALT: 20 U/L (ref 0–35)
AST: 30 U/L (ref 0–35)
Albumin/Globulin Ratio: 1 (ref 1.00–2.70)
Albumin: 4.3 g/dL (ref 3.5–5.2)
Alk Phosphatase: 87 U/L (ref 35–117)
Anion Gap: 13 mmol/L (ref 2–17)
BUN: 13 mg/dL (ref 8–23)
CO2: 21 mmol/L — ABNORMAL LOW (ref 22–29)
Calcium: 9.3 mg/dL (ref 8.8–10.2)
Chloride: 101 mmol/L (ref 98–107)
Creatinine: 0.7 mg/dL (ref 0.5–1.0)
Est, Glom Filt Rate: 96 mL/min/1.73m?? (ref >=90)
Globulin: 3 g/dL (ref 1.9–4.4)
Glucose: 170 mg/dL — ABNORMAL HIGH (ref 70–99)
OSMOLALITY CALCULATED: 274 mOsm/kg (ref 270–287)
Potassium: 4.3 mmol/L (ref 3.5–5.3)
Sodium: 135 mmol/L (ref 135–145)
Total Bilirubin: 0.46 mg/dL (ref 0.00–1.20)
Total Protein: 7.2 g/dL (ref 6.4–8.3)

## 2021-05-14 LAB — BRAIN NATRIURETIC PEPTIDE: NT Pro-BNP: 3035 pg/mL — ABNORMAL HIGH (ref 0–125)

## 2021-05-14 LAB — CBC WITH AUTO DIFFERENTIAL
Absolute Baso #: 0 10*3/uL (ref 0.0–0.2)
Absolute Eos #: 0 10*3/uL (ref 0.0–0.5)
Absolute Lymph #: 0.4 10*3/uL — ABNORMAL LOW (ref 1.0–3.2)
Absolute Mono #: 0.1 10*3/uL — ABNORMAL LOW (ref 0.3–1.0)
Basophils %: 0 % (ref 0.0–2.0)
Eosinophils %: 0.4 % (ref 0.0–7.0)
Hematocrit: 41.3 % (ref 34.0–47.0)
Hemoglobin: 14.3 g/dL (ref 11.5–15.7)
Immature Grans (Abs): 0 10*3/uL (ref 0.00–0.06)
Immature Granulocytes: 0 % (ref 0.0–0.6)
Lymphocytes: 9.1 % — ABNORMAL LOW (ref 15.0–45.0)
MCH: 31.8 pg (ref 27.0–34.5)
MCHC: 34.6 g/dL (ref 32.0–36.0)
MCV: 92 fL (ref 81.0–99.0)
MPV: 9.5 fL (ref 7.2–13.2)
Monocytes: 1.7 % — ABNORMAL LOW (ref 4.0–12.0)
Neutrophils %: 88.8 % — ABNORMAL HIGH (ref 42.0–74.0)
Neutrophils Absolute: 4.2 10*3/uL (ref 1.6–7.3)
Platelets: 133 10*3/uL — ABNORMAL LOW (ref 140–440)
RBC: 4.49 x10e6/mcL (ref 3.60–5.20)
RDW: 12.3 % (ref 11.0–16.0)
WBC: 4.7 10*3/uL (ref 3.8–10.6)

## 2021-05-14 LAB — COVID-19 & INFLUENZA COMBO (LIAT HOSPITAL)
INFLUENZA A: NOT DETECTED
INFLUENZA B: NOT DETECTED
SARS-CoV-2: NOT DETECTED

## 2021-05-14 LAB — MAGNESIUM: Magnesium: 1.1 mg/dL — ABNORMAL LOW (ref 1.6–2.6)

## 2021-05-14 LAB — POC COVID-19 & INFLUENZA COMBO (LIAT IN HOUSE)
INFLUENZA A: NOT DETECTED
INFLUENZA B: NOT DETECTED
SARS-CoV-2: NOT DETECTED

## 2021-05-14 LAB — PROCALCITONIN: Procalcitonin: 0.04 ng/mL (ref <=0.24)

## 2021-05-14 LAB — LACTIC ACID: Lactic Acid: 1.4 mmol/L (ref 0.5–2.0)

## 2021-05-14 LAB — TROPONIN: Troponin T: <0.01 ng/mL (ref 0.000–0.010)

## 2021-05-14 MED ORDER — CEFTRIAXONE SODIUM 1 G IJ SOLR
1 g | Freq: Once | INTRAMUSCULAR | Status: AC
Start: 2021-05-14 — End: 2021-05-14
  Administered 2021-05-14: 18:00:00 1000 mg via INTRAMUSCULAR

## 2021-05-14 MED ORDER — AMOXICILLIN-POT CLAVULANATE 875-125 MG PO TABS
875-125 MG | ORAL_TABLET | Freq: Two times a day (BID) | ORAL | 0 refills | Status: AC
Start: 2021-05-14 — End: 2021-05-24

## 2021-05-14 MED ORDER — PREDNISONE 20 MG PO TABS
20 MG | ORAL_TABLET | Freq: Every day | ORAL | 0 refills | Status: DC
Start: 2021-05-14 — End: 2021-05-19

## 2021-05-14 MED ORDER — METHYLPREDNISOLONE SODIUM SUCC 125 MG IJ SOLR
125 MG | Freq: Once | INTRAMUSCULAR | Status: AC
Start: 2021-05-14 — End: 2021-05-14
  Administered 2021-05-14: 18:00:00 125 mg via INTRAMUSCULAR

## 2021-05-14 MED ORDER — METHYLPREDNISOLONE SODIUM SUCC 125 MG IJ SOLR
125 MG | Freq: Four times a day (QID) | INTRAMUSCULAR | Status: DC
Start: 2021-05-14 — End: 2021-05-14

## 2021-05-14 MED ORDER — AZITHROMYCIN 250 MG PO TABS
250 MG | ORAL_TABLET | ORAL | 0 refills | Status: DC
Start: 2021-05-14 — End: 2021-05-19

## 2021-05-14 MED ORDER — IPRATROPIUM-ALBUTEROL 0.5-2.5 (3) MG/3ML IN SOLN
Freq: Once | RESPIRATORY_TRACT | Status: AC
Start: 2021-05-14 — End: 2021-05-14
  Administered 2021-05-14: 18:00:00 2 via RESPIRATORY_TRACT

## 2021-05-14 MED ORDER — ALBUTEROL SULFATE (2.5 MG/3ML) 0.083% IN NEBU
Freq: Four times a day (QID) | RESPIRATORY_TRACT | Status: DC | PRN
Start: 2021-05-14 — End: 2021-05-19

## 2021-05-14 MED ORDER — MAGNESIUM SULFATE IN D5W 1-5 GM/100ML-% IV SOLN
1-5 GM/100ML-% | Freq: Once | INTRAVENOUS | Status: AC
Start: 2021-05-14 — End: 2021-05-14
  Administered 2021-05-14: 21:00:00 1000 mg via INTRAVENOUS

## 2021-05-14 MED ORDER — IPRATROPIUM-ALBUTEROL 0.5-2.5 (3) MG/3ML IN SOLN
RESPIRATORY_TRACT | Status: AC
Start: 2021-05-14 — End: 2021-05-14
  Administered 2021-05-14: 21:00:00 1 via RESPIRATORY_TRACT

## 2021-05-14 MED FILL — IPRATROPIUM-ALBUTEROL 0.5-2.5 (3) MG/3ML IN SOLN: RESPIRATORY_TRACT | Qty: 3

## 2021-05-14 MED FILL — MAGNESIUM SULFATE IN D5W 1-5 GM/100ML-% IV SOLN: 1-5 GM/100ML-% | INTRAVENOUS | Qty: 100

## 2021-05-14 NOTE — H&P (Addendum)
History &Physical    Patient:  Melanie Sandoval  Date of Birth: [birth year]  Date of Service: 05/14/21  MRN: [id number]   Acct:  [id number]   Primary Care Physician: Lavell Luster III, MD    Chief Complaint: Worsening Dyspnea x 3 days     History of Present Illness:   Patient is a 65 y.o. female with past medical history of COPD on Advair, Spiriva, not on home oxygen who continues to smoke about half a pack per day for the past 50 years, hypothyroidism, hyperlipidemia presented to an urgent care in [place] with concerns of ongoing shortness of breath for the past 2 or 3 days.  Patient states that since Thursday she has had profound dyspnea on exertion and today she became very short of breath even when walking from her bedroom to her bathroom prompting a visit to the urgent care.  Over there she was found to have O2 sats in the 80s upon ambulation after which they advised admission.  Patient however chose to come home stating that her daughter works at [employer] and subsequently presented to the [facility] ED.  At the outside hospital he received IV ceftriaxone, nebulized bronchodilators, as well as a dose of steroids.  Upon arrival here she was noted to be requiring 2 L nasal cannula and hence subsequently and admission was requested for further management.    Upon my assessment with the patient she denied any prior exposure to birds, asbestos, any amiodarone use.  I discussed with her that her prior CT scan done in August revealed findings consistent with an interstitial lung disease.  She denied any personal or family history of any rheumatologic disease, denied any chest pain, nausea, vomiting, diarrhea.  She denied any orthopnea.  She denied any personal history of DVT/PE.    She states that she smokes cigarettes but denied any other significant recreational drug use.  She states that she had PFTs done earlier in the year with her pulmonologist.  She states that he did mention something about pulmonary fibrosis  to her.    Blood work in the ED revealed hypomagnesemia, elevated NT proBNP to 3000 and a normal CBC.  Her chest x-ray revealed bilateral interstitial disease process.    REVIEW OF SYSTEMS:      A 14 point review of systems was completed and is otherwise negative    Past Medical History:        Diagnosis Date    Asthma     COPD (chronic obstructive pulmonary disease) ([place])     Hyperlipidemia     Hypertension     Hypothyroidism        Past Surgical History:        Procedure Laterality Date    ELBOW SURGERY Left     KNEE ARTHROSCOPY Right     THYROID SURGERY         Home Medications:   No current facility-administered medications on file prior to encounter.     Current Outpatient Medications on File Prior to Encounter   Medication Sig Dispense Refill    magnesium oxide (MAG-OX) 400 MG tablet Take 400 mg by mouth daily      Cholecalciferol (VITAMIN D) 50 MCG (2000 UT) CAPS capsule Take 2,000 Units by mouth daily      omeprazole (PRILOSEC OTC) 20 MG tablet Take 20 mg by mouth daily      albuterol sulfate HFA (PROVENTIL;VENTOLIN;PROAIR) 108 (90 Base) MCG/ACT inhaler 1 puff as needed Inhalation every  4 hrs      atorvastatin (LIPITOR) 10 MG tablet Take 10 mg by mouth daily      fluticasone-salmeterol (ADVAIR) 250-50 MCG/ACT AEPB diskus inhaler Inhale into the lungs 2 times daily      hydrOXYzine HCl (ATARAX) 25 MG tablet Take 25 mg by mouth every 8 hours as needed      losartan (COZAAR) 50 MG tablet Take 50 mg by mouth daily      montelukast (SINGULAIR) 10 MG tablet 1 tablet Orally Once a day for 30 day(s)      tiotropium (SPIRIVA HANDIHALER) 18 MCG inhalation capsule 1 capsule by inhaling the contents of the capsule using the handihaler device Inhalation Once a day (Patient not taking: Reported on 05/14/2021)      amoxicillin-clavulanate (AUGMENTIN) 875-125 MG per tablet Take 1 tablet by mouth in the morning and 1 tablet in the evening. Do all this for 10 days. 20 tablet 0    predniSONE (DELTASONE) 20 MG tablet Take 2  tablets by mouth daily for 5 days 5 tablet 0    azithromycin (ZITHROMAX) 250 MG tablet Take 1 tablet by mouth See Admin Instructions for 5 days 552m on day 1 followed by 2530mon days 2 - 5 6 tablet 0    levothyroxine (SYNTHROID) 88 MCG tablet 1 tablet daily by mouth 90 tablet 0    montelukast (SINGULAIR) 10 MG tablet Take 1 tablet by mouth nightly 90 tablet 0       Allergies:  Patient has no known allergies.    Social History:    reports that she has been smoking cigarettes. She has been smoking an average of .25 packs per day. She has never used smokeless tobacco.    Family History:       Problem Relation Age of Onset    Heart Attack Father                   PHYSICAL EXAM:  BP 126/86    Pulse 73    Temp 97.8 ??F (36.6 ??C) (Oral)    Resp 20    Ht '5\' 2"'  (1.575 m)    Wt 135 lb (61.2 kg)    SpO2 92%    BMI 24.69 kg/m??     Present Caucasian female appears in some respiratory distress  PERRLA, EOMI, no JVD  Normal S1/S2, no murmurs rubs or gallops, regular rate rhythm  Bibasilar Velcro-like crackles appreciated.  Abdomen soft, nontender, nondistended  No lower extremity pitting edema  Clubbing noted in bilateral upper extremities.    Review of Labs and Diagnostic Testing:    Recent Results (from the past 24 hour(s))   COVID-19 & Influenza Combo ([facility])    Collection Time: 05/14/21 12:13 PM   Result Value Ref Range    SARS-CoV-2 Not Detected Not Detected    INFLUENZA A Not Detected Not Detected    INFLUENZA B Not Detected Not Detected   CBC with Auto Differential    Collection Time: 05/14/21  3:32 PM   Result Value Ref Range    WBC 4.7 3.8 - 10.6 x10e3/mcL    RBC 4.49 3.60 - 5.20 x10e6/mcL    Hemoglobin 14.3 11.5 - 15.7 g/dL    Hematocrit 41.3 34.0 - 47.0 %    MCV 92.0 81.0 - 99.0 fL    MCH 31.8 27.0 - 34.5 pg    MCHC 34.6 32.0 - 36.0 g/dL    RDW 12.3 11.0 - 16.0 %  Platelets 133 (L) 140 - 440 x10e3/mcL    MPV 9.5 7.2 - 13.2 fL    Neutrophils % 88.8 (H) 42.0 - 74.0 %    Lymphocytes 9.1 (L) 15.0 - 45.0 %     Monocytes 1.7 (L) 4.0 - 12.0 %    Eosinophils % 0.4 0.0 - 7.0 %    Basophils % 0.0 0.0 - 2.0 %    Neutrophils Absolute 4.2 1.6 - 7.3 x10e3/mcL    Absolute Lymph # 0.4 (L) 1.0 - 3.2 x10e3/mcL    Absolute Mono # 0.1 (L) 0.3 - 1.0 x10e3/mcL    Absolute Eos # 0.0 0.0 - 0.5 x10e3/mcL    Absolute Baso # 0.0 0.0 - 0.2 x10e3/mcL    Immature Granulocytes 0.0 0.0 - 0.6 %    Immature Grans (Abs) 0.00 0.00 - 0.06 x10e3/mcL   Comprehensive Metabolic Panel    Collection Time: 05/14/21  3:32 PM   Result Value Ref Range    Sodium 135 135 - 145 mmol/L    Potassium 4.3 3.5 - 5.3 mmol/L    Chloride 101 98 - 107 mmol/L    CO2 21 (L) 22 - 29 mmol/L    Glucose 170 (H) 70 - 99 mg/dL    BUN 13 8 - 23 mg/dL    Creatinine 0.7 0.5 - 1.0 mg/dL    Anion Gap 13 2 - 17 mmol/L    OSMOLALITY CALCULATED 274 270 - 287 mOsm/kg    Calcium 9.3 8.8 - 10.2 mg/dL    Total Protein 7.2 6.4 - 8.3 g/dL    Albumin 4.3 3.5 - 5.2 g/dL    Globulin 3.0 1.9 - 4.4 g/dL    Albumin/Globulin Ratio 1.00 1.00 - 2.70    Total Bilirubin 0.46 0.00 - 1.20 mg/dL    Alk Phosphatase 87 35 - 117 unit/L    AST 30 0 - 35 unit/L    ALT 20 0 - 35 unit/L    Est, Glom Filt Rate 96 >=90 mL/min/1.71m?   Troponin    Collection Time: 05/14/21  3:32 PM   Result Value Ref Range    Troponin T <0.010 0.000 - 0.010 ng/mL   Brain Natriuretic Peptide    Collection Time: 05/14/21  3:32 PM   Result Value Ref Range    NT Pro-BNP 3,035 (H) 0 - 125 pg/mL   Magnesium    Collection Time: 05/14/21  3:32 PM   Result Value Ref Range    Magnesium 1.1 (L) 1.6 - 2.6 mg/dL   Lactic Acid    Collection Time: 05/14/21  3:32 PM   Result Value Ref Range    Lactic Acid 1.4 0.5 - 2.0 mmol/L   Procalcitonin    Collection Time: 05/14/21  3:32 PM   Result Value Ref Range    Procalcitonin 0.04 <=0.24 ng/mL   COVID-19 & Influenza Combo ([facility]    Collection Time: 05/14/21  3:33 PM    Specimen: Nasopharyngeal   Result Value Ref Range    SARS-CoV-2 Not Detected Not Detected    INFLUENZA A Not Detected Not Detected     INFLUENZA B Not Detected Not Detected       CXR: 05/14/2021      CXR: 05/14/2021  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 No acute airspace    consolidation identified.       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   Appearance unchanged since prior. No superimposed acute process. Stable   pulmonary nodules. One-year follow-up chest CT recommended to reevaluate.   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     EKG 11/6:  Normal sinus rhythm with T wave inversions in anteroseptal leads.    Assessment:    Principal Problem:    Acute respiratory failure with hypoxia (HCC)  Active Problems:    Interstitial lung disease (HCC)    Cigarette nicotine dependence without complication    COPD with acute exacerbation (HCC)    Hypomagnesemia    Acute respiratory failure with hypoxemia (HCC)  Resolved Problems:    * No resolved hospital problems. 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    1.  Acute hypoxic respiratory failure in setting of combined CPFE (combined pulmonary fibrosis/emphysema)  Patient has clear Velcro-like crackles in the bibasilar areas.  Chest x-ray corroborates findings noted on prior CT scan from August.  She is on triple inhaler therapy with Advair and Spiriva but not on any antifibrotic agent.  She is requiring 2 L nasal cannula today.  She was influenza, COVID-negative.  Plan  -Obtain CT PE  -Obtain RF, anti-CCP, ANA with reflex  -Continue supplemental oxygen to maintain sats greater than 88%  -Her NT proBNP was elevated, will obtain TTE to screen for heart failure and pulmonary hypertension  -Continue prednisone  40 mg daily, further discussion about higher doses of steroids for CT scan tomorrow AM.  -Follow infectious work-up with blood cultures, sputum culture  -Continue ceftriaxone and azithromycin for now  -DVT prophylaxis with Lovenox 40 mg subcutaneous  -Keep net even/net negative fluid balance  -Resume home Singulair  -DuoNebs every 6 hours as needed.  -Continue home Advair and Spiriva for now  -Consider pulmonology consult.  -Obtain baseline ABG.    2.  History of hypothyroidism  Plan  -Check TSH with reflex  -Continue home Synthroid    3.  History of hypertension  Plan  -Resume home losartan 50 mg daily    4.  GERD  Plan  -Continue home Prilosec    5.  Nicotine dependence  Plan  -Start nicotine patch 14 mg daily    6.  Hypomagnesemia  Plan  -Recheck magnesium in the morning, got 1 g of mag in the ED today.     7.  Hyperglycemia  Plan  -Fingersticks 3 times daily AC at bedtime  -Checked A1c.  -Sliding scale insulin.        Diet: ADULT DIET; Regular   DVT prophylaxis: Lovenox SC  Code status: Full Code   Disposition: Inpatient       Electronically signed by Joeseph Amor, MD on 05/14/2021 at 9:20 PM

## 2021-05-14 NOTE — Progress Notes (Signed)
Melanie Sandoval (DOB:  1955-12-03) is a 65 y.o. female,Established patient, here for evaluation of the following chief complaint(s):  Cough (Pt has felt poorly since Friday. She has COPD. She has had cough, congestion and a HA.  Pt would like to be checked for Covid/flu. )          Subjective   SUBJECTIVE/OBJECTIVE:  Patient comes in for evaluation of sob, intermittent productive cough, and runny nose x 3 days. She states sob is with exertion and she feels she may pass out with lengthy distances >3 feet. Patient has history of COPD and is current day  smoker of 1/2 packs per day up until she got sick. She is taking albuterol with minimal improvement. Denies fever, chest pain, hemoptysis, and swelling of legs.       Cough  Associated symptoms include rhinorrhea and shortness of breath.   No Known Allergies  Current Outpatient Medications   Medication Sig Dispense Refill    albuterol sulfate HFA (PROVENTIL;VENTOLIN;PROAIR) 108 (90 Base) MCG/ACT inhaler 1 puff as needed Inhalation every 4 hrs      atorvastatin (LIPITOR) 10 MG tablet       fluticasone-salmeterol (ADVAIR) 250-50 MCG/ACT AEPB diskus inhaler       hydrOXYzine HCl (ATARAX) 25 MG tablet 0, 11/15/20 18:47:00 EDT      montelukast (SINGULAIR) 10 MG tablet 1 tablet Orally Once a day for 30 day(s)      tiotropium (SPIRIVA HANDIHALER) 18 MCG inhalation capsule 1 capsule by inhaling the contents of the capsule using the handihaler device Inhalation Once a day      amoxicillin-clavulanate (AUGMENTIN) 875-125 MG per tablet Take 1 tablet by mouth in the morning and 1 tablet in the evening. Do all this for 10 days. 20 tablet 0    predniSONE (DELTASONE) 20 MG tablet Take 2 tablets by mouth daily for 5 days 5 tablet 0    azithromycin (ZITHROMAX) 250 MG tablet Take 1 tablet by mouth See Admin Instructions for 5 days 500mg  on day 1 followed by 250mg  on days 2 - 5 6 tablet 0    levothyroxine (SYNTHROID) 88 MCG tablet 1 tablet daily by mouth 90 tablet 0    montelukast  (SINGULAIR) 10 MG tablet Take 1 tablet by mouth nightly 90 tablet 0    levothyroxine (SYNTHROID) 88 MCG tablet 1 tablet in the morning on an empty stomach Orally Once a day for 90 days (Patient not taking: Reported on 05/14/2021)      losartan (COZAAR) 50 MG tablet        No current facility-administered medications for this visit.     Past Medical History:   Diagnosis Date    Asthma     COPD (chronic obstructive pulmonary disease) (HCC)     Hyperlipidemia     Hypertension     Hypothyroidism      Past Surgical History:   Procedure Laterality Date    ELBOW SURGERY Left     KNEE ARTHROSCOPY Right     THYROID SURGERY         BP (!) 152/92    Pulse 74    Temp 98.5 ??F (36.9 ??C)    Resp 18    Ht 5\' 2"  (1.575 m)    Wt 135 lb (61.2 kg)    SpO2 90% Comment: post neb treatment   BMI 24.69 kg/m??        Review of Systems   HENT:  Positive for congestion and rhinorrhea.  Respiratory:  Positive for cough and shortness of breath.        Objective   Physical Exam  Constitutional:       Appearance: Normal appearance.   HENT:      Right Ear: Tympanic membrane, ear canal and external ear normal.      Left Ear: Tympanic membrane, ear canal and external ear normal.      Nose: Congestion and rhinorrhea present.      Mouth/Throat:      Mouth: Mucous membranes are moist.      Pharynx: Oropharynx is clear.   Eyes:      Extraocular Movements: Extraocular movements intact.      Conjunctiva/sclera: Conjunctivae normal.      Pupils: Pupils are equal, round, and reactive to light.   Cardiovascular:      Rate and Rhythm: Normal rate and regular rhythm.      Pulses: Normal pulses.      Heart sounds: Normal heart sounds.   Pulmonary:      Effort: Respiratory distress present.      Breath sounds: Rhonchi (mild rhonchi throughout with diminished bases, mild tachpnea (able to speak in sentences), no accessory musle use) present.   Skin:     General: Skin is warm.      Capillary Refill: Capillary refill takes less than 2 seconds.   Neurological:       General: No focal deficit present.      Mental Status: She is alert and oriented to person, place, and time.               ASSESSMENT/PLAN:  1. COPD with acute exacerbation (HCC)    Patient is noticeably short of breath, she does not have supplemental oxygen at home. At rest oxygen is 90 percent on RA, she is walked 15 feet and she is noticed to have Sats 82 percent on RA. Pt is made aware she need to go to the ER, she is offered EMS. She states she is not going to the ER, she is going home. Pt is given Rocephin 1 g IM, solumedrol 125 mg IM, and given Duoneb (5:1) while waiting. Dr. Noralee Stain is made aware of pt condition and has discussed with patient the importance ER evaluation. Pt has declined, she has called her daughter Cordelia Pen for me to speak to, she has asked what her oxygen sats are after post treatment. She is made aware pt is in the beginning of treatment and despite improvement we are not able to provide her services needed such as observation, blood work, Catering manager. She states she will go home and she will pick her up there to go to the ER. It has been emphasized risk of leaving AMA.     Pt has mild improvement in status post treatment, she is less sob and her Sats91 on RA. She has signed AMA paperwork and made aware she still needs ER evaluation despite improvement. She is prescribed prednisone 40 mg daily x 5 days, Augmentin 875 mg bid x 10 days, and Azithromycin (zpack).     No problem-specific Assessment & Plan notes found for this encounter.    Results for orders placed or performed in visit on 05/14/21   COVID-19 & Influenza Combo ([facility])    Specimen: Nasal swab   Result Value Ref Range    SARS-CoV-2 Not Detected Not Detected    INFLUENZA A Not Detected Not Detected    INFLUENZA B Not Detected Not Detected  Return if symptoms worsen or fail to improve.                                     An electronic signature was used to authenticate this note.    Alexia Freestone, DNP,  FNP-C  05/14/2021

## 2021-05-14 NOTE — ED Provider Notes (Signed)
[facility]  EMERGENCY DEPARTMENT ENCOUNTER      Pt Name: Melanie Sandoval  MRN: [id number]  Birthdate July 26, 1955  Date of evaluation: 05/14/2021  Provider: Tsosie Billing, MD    CHIEF COMPLAINT       Chief Complaint   Patient presents with    Shortness of Breath     Was seen at urgent care in [place].  Got IM rocephin, steroids, covid and flu were negative.  They recommended admit however pt refused,          HISTORY OF PRESENT ILLNESS    This is a 65 year old female with past medical history of COPD not on home O2 followed by Dr. Charlie Pitter who presents with concerns of COPD exacerbation.  Reportedly has not felt well for the last 2 or 3 days, felt feverish yesterday but not today.  She still smokes, she is quit for the last 4 days.  Reportedly has been feeling short of breath since Thursday, worse with exertion, no orthopnea.  No history of heart failure.  Reports shortness of breath is worse with ambulation.  She feels that she is wheezing.  She went to a [place] urgent care who tried to admit her after she had a walking O2 sats in the 80s.  She declined.  She was reportedly given a dose of IV Rocephin.  She notes nonproductive cough.  She denies any hemoptysis.  No history of VTE.  No calf swelling.  No weight gain.  She denies any hospitalizations for COPD exacerbation in the past.  She reportedly went home and her friend urged her to come to the [facility] ER for further evaluation and likely admission.  Patient was given steroids, antibiotics and a DuoNeb at the labs in urgent care.    Shortness of breath x3 days, worsening, improved with rest, worsened by exertion.  Improved by DuoNeb.  No chest pain.  No abdominal pain.  No vomiting.  No objective fevers.    The history is provided by the patient and a friend.     Nursing Notes were reviewed.    REVIEW OF SYSTEMS       Review of Systems   Constitutional:  Negative for chills and fever.   HENT:  Positive for rhinorrhea. Negative for ear pain and  sore throat.    Eyes:  Negative for pain.   Respiratory:  Positive for cough and shortness of breath.    Cardiovascular:  Negative for chest pain and palpitations.   Gastrointestinal:  Negative for abdominal pain, diarrhea, nausea and vomiting.   Genitourinary:  Negative for flank pain.   Musculoskeletal:  Negative for back pain and neck pain.   Skin:  Negative for rash.   Neurological:  Negative for headaches.     Except as noted above the remainder of the review of systems was reviewed and negative.       PAST MEDICAL HISTORY     Past Medical History:   Diagnosis Date    Asthma     COPD (chronic obstructive pulmonary disease) (HCC)     Hyperlipidemia     Hypertension     Hypothyroidism        SURGICAL HISTORY       Past Surgical History:   Procedure Laterality Date    ELBOW SURGERY Left     KNEE ARTHROSCOPY Right     THYROID SURGERY         CURRENT MEDICATIONS       Previous  Medications    ALBUTEROL SULFATE HFA (PROVENTIL;VENTOLIN;PROAIR) 108 (90 BASE) MCG/ACT INHALER    1 puff as needed Inhalation every 4 hrs    AMOXICILLIN-CLAVULANATE (AUGMENTIN) 875-125 MG PER TABLET    Take 1 tablet by mouth in the morning and 1 tablet in the evening. Do all this for 10 days.    ATORVASTATIN (LIPITOR) 10 MG TABLET    Take 10 mg by mouth daily    AZITHROMYCIN (ZITHROMAX) 250 MG TABLET    Take 1 tablet by mouth See Admin Instructions for 5 days 500mg  on day 1 followed by 250mg  on days 2 - 5    CHOLECALCIFEROL (VITAMIN D) 50 MCG (2000 UT) CAPS CAPSULE    Take 2,000 Units by mouth daily    FLUTICASONE-SALMETEROL (ADVAIR) 250-50 MCG/ACT AEPB DISKUS INHALER    Inhale into the lungs 2 times daily    HYDROXYZINE HCL (ATARAX) 25 MG TABLET    Take 25 mg by mouth every 8 hours as needed    LEVOTHYROXINE (SYNTHROID) 88 MCG TABLET    1 tablet daily by mouth    LOSARTAN (COZAAR) 50 MG TABLET    Take 50 mg by mouth daily    MAGNESIUM OXIDE (MAG-OX) 400 MG TABLET    Take 400 mg by mouth daily    MONTELUKAST (SINGULAIR) 10 MG TABLET    Take 1  tablet by mouth nightly    MONTELUKAST (SINGULAIR) 10 MG TABLET    1 tablet Orally Once a day for 30 day(s)    OMEPRAZOLE (PRILOSEC OTC) 20 MG TABLET    Take 20 mg by mouth daily    PREDNISONE (DELTASONE) 20 MG TABLET    Take 2 tablets by mouth daily for 5 days    TIOTROPIUM (SPIRIVA HANDIHALER) 18 MCG INHALATION CAPSULE    1 capsule by inhaling the contents of the capsule using the handihaler device Inhalation Once a day       ALLERGIES     Patient has no known allergies.    FAMILY HISTORY       Family History   Problem Relation Age of Onset    Heart Attack Father         SOCIAL HISTORY       Social History     Socioeconomic History    Marital status: Single   Tobacco Use    Smoking status: Every Day     Packs/day: 0.25     Types: Cigarettes    Smokeless tobacco: Never       SCREENINGS         Glasgow Coma Scale  Eye Opening: Spontaneous  Best Verbal Response: Oriented  Best Motor Response: Obeys commands  Glasgow Coma Scale Score: 15                     CIWA Assessment  BP: (!) 145/98  Heart Rate: 67                 PHYSICAL EXAM    (up to 7 for level 4, 8 or more for level 5)     ED Triage Vitals [05/14/21 1458]   BP Temp Temp Source Heart Rate Resp SpO2 Height Weight   (!) 173/117 97.8 ??F (36.6 ??C) Oral 86 22 (!) 84 % 5\' 2"  (1.575 m) 135 lb (61.2 kg)       Physical Exam  Vitals and nursing note reviewed.   Constitutional:       General: She is not  in acute distress.     Appearance: Normal appearance. She is not ill-appearing.   HENT:      Head: Normocephalic.      Nose: Nose normal.      Mouth/Throat:      Mouth: Mucous membranes are moist.      Pharynx: Oropharynx is clear.   Eyes:      Extraocular Movements: Extraocular movements intact.      Conjunctiva/sclera: Conjunctivae normal.      Pupils: Pupils are equal, round, and reactive to light.   Cardiovascular:      Rate and Rhythm: Normal rate and regular rhythm.      Pulses: Normal pulses.      Heart sounds: Normal heart sounds.   Pulmonary:      Effort:  Tachypnea present. No accessory muscle usage or respiratory distress.      Breath sounds: Examination of the right-upper field reveals wheezing. Examination of the left-upper field reveals wheezing. Examination of the right-middle field reveals wheezing. Examination of the left-middle field reveals wheezing. Examination of the right-lower field reveals wheezing. Examination of the left-lower field reveals wheezing. Wheezing present.      Comments: Patient has end expiratory wheezing, speaking in full sentences, she is currently on 2 L nasal cannula and improved.  Ambulated and was 85% on room air.  Chest:      Chest wall: No mass.   Abdominal:      General: Abdomen is flat. Bowel sounds are normal.      Palpations: Abdomen is soft. There is no mass.      Tenderness: There is no abdominal tenderness. There is no right CVA tenderness, left CVA tenderness, guarding or rebound.      Hernia: No hernia is present.   Musculoskeletal:         General: No swelling, tenderness, deformity or signs of injury. Normal range of motion.      Cervical back: Normal range of motion and neck supple.      Right lower leg: No edema.   Skin:     General: Skin is warm and dry.      Capillary Refill: Capillary refill takes less than 2 seconds.      Coloration: Skin is not jaundiced or pale.   Neurological:      General: No focal deficit present.      Mental Status: She is alert and oriented to person, place, and time. Mental status is at baseline.      Cranial Nerves: No cranial nerve deficit.   Psychiatric:         Mood and Affect: Mood normal.         Behavior: Behavior normal.         Thought Content: Thought content normal.         Judgment: Judgment normal.       DIAGNOSTIC RESULTS       PROCEDURES:  Unless otherwise noted below, none     Procedures    EKG: All EKG's are interpreted by the Emergency Department Physician who either signs or Co-signs this chart in the absence of a cardiologist.    LABS:  Labs Reviewed   CBC WITH AUTO  DIFFERENTIAL - Abnormal; Notable for the following components:       Result Value    Platelets 133 (*)     Neutrophils % 88.8 (*)     Lymphocytes 9.1 (*)     Monocytes 1.7 (*)     Absolute  Lymph # 0.4 (*)     Absolute Mono # 0.1 (*)     All other components within normal limits   COMPREHENSIVE METABOLIC PANEL - Abnormal; Notable for the following components:    CO2 21 (*)     Glucose 170 (*)     All other components within normal limits   BRAIN NATRIURETIC PEPTIDE - Abnormal; Notable for the following components:    NT Pro-BNP 3,035 (*)     All other components within normal limits   MAGNESIUM - Abnormal; Notable for the following components:    Magnesium 1.1 (*)     All other components within normal limits   COVID-19 & INFLUENZA COMBO [facility])   CULTURE, BLOOD 1   CULTURE, BLOOD 1   TROPONIN   LACTIC ACID   PROCALCITONIN       All other labs were within normal range or not returned as of this dictation.    RADIOLOGY:   Non-plain film images such as CT, Ultrasound and MRI are read by the radiologist. 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 No acute airspace    consolidation identified.            EMERGENCY DEPARTMENT COURSE/REASSESSMENT and MDM:   Vitals:    Vitals:    05/14/21 1458 05/14/21 1610 05/14/21 1719 05/14/21 1732   BP: (!) 173/117 (!) 140/99  (!) 145/98   Pulse: 86 71 66 67   Resp: 22 12 20 20    Temp: 97.8 ??F (36.6 ??C)      TempSrc: Oral      SpO2: (!) 84% 95% 93% 93%   Weight: 61.2 kg      Height: 5\' 2"  (1.575 m)          ED Course:       MDM  Number of Diagnoses or Management Options  COPD exacerbation (HCC)  Diagnosis management comments: 65 year old female presents with COPD exacerbation  Received IV antibiotics Rocephin as well as IV  steroids and a DuoNeb at a urgent care who tried to admit her which she declined  Followed by pulmonology Dr. Charlie Pitter  Walking O2 sat 85% here in the emergency room  Will give DuoNeb, 1 g of IV magnesium.  We will hold off on steroids as she received them earlier today  On chronic home O2      Patient admitted for COPD exacerbation improving on serial exams, currently on a DuoNeb not requiring BiPAP or intubation.  She has chronically poor lungs unchanged on chest x-ray today compared to CT according to radiology.  She did receive 1 g of IV magnesium.  This will help with her hypomagnesemia noted.  She is on 2 L O2.  She has received multiple duo nebs.  Antibiotics withheld as she has negative Pro-Cal.  No focal consolidation on chest x-ray.  Will defer to inpatient team.  Steroids were given prior to arrival.  Will benefit from further evaluation inpatient.  She does have elevated proBNP likely secondary to her pulmonary symptoms, no evidence of pedal edema or JVD on exam.  She denies any significant orthopnea.  Patient admitted in stable condition.      EKG not crossing over however patient does have  nonspecific T wave changes with no previous EKG for comparison.  Recommend further evaluation inpatient, narrow QRS noted, normal QT.  No STEMI equivalent noted.        CONSULTS:  IP CONSULT TO HOSPITALIST    FINAL IMPRESSION      1. COPD exacerbation (HCC)          DISPOSITION/PLAN   DISPOSITION Decision To Admit 05/14/2021 06:01:14 PM      PATIENT REFERRED TO:  No follow-up provider specified.    DISCHARGE MEDICATIONS:  New Prescriptions    No medications on file       Controlled Substances Monitoring:     No flowsheet data found.    (Please note that portions of this note were completed with a voice recognition program.  Efforts were made to edit the dictations but occasionally words are mis-transcribed.)    Tsosie Billing, MD (electronically signed)  Attending Emergency Physician           Tsosie Billing, MD  05/14/21 [phone]

## 2021-05-15 ENCOUNTER — Inpatient Hospital Stay: Admit: 2021-05-15 | Payer: MEDICARE | Primary: Family Medicine

## 2021-05-15 LAB — TRANSTHORACIC ECHOCARDIOGRAM (TTE) COMPLETE (CONTRAST/BUBBLE/3D PRN)
AV Area by Peak Velocity: 2 cm2
AV Area by VTI: 1.7 cm2
AV Mean Gradient: 3 mmHg
AV Mean Velocity: 0.9 m/s
AV Peak Gradient: 5 mmHg
AV Peak Velocity: 1.2 m/s
AV VTI: 30.1 cm
AV Velocity Ratio: 0.75
AVA/BSA Peak Velocity: 1.2 cm2/m2
AVA/BSA VTI: 1 cm2/m2
Ao Root Index: 2.28 cm/m2
Aortic Root: 3.7 cm
Ascending Aorta Index: 2.1 cm/m2
Ascending Aorta: 3.4 cm
Body Surface Area: 1.64 m2
E/E' Lateral: 8.75
E/E' Ratio (Averaged): 11.38
E/E' Septal: 14
Est. RA Pressure: 10 mmHg
Fractional Shortening 2D: 37 % (ref 28–44)
IVC Expiration: 2.3 cm
IVSd: 0.9 cm (ref 0.6–0.9)
LA Area 4C: 19.2 cm2
LA Diameter: 3.7 cm
LA Major Axis: 6.2 cm
LA Minor Axis: 3.7 cm
LA Size Index: 2.28 cm/m2
LA Volume 4C: 47 mL (ref 22–52)
LA Volume BP: 47 mL (ref 22–52)
LA Volume Index 4C: 29 mL/m2 (ref 16–34)
LA Volume Index BP: 29 ml/m2 (ref 16–34)
LA/AO Root Ratio: 1
LV E' Lateral Velocity: 8 cm/s
LV E' Septal Velocity: 5 cm/s
LV EDV A4C: 81 mL
LV EDV Index A4C: 50 mL/m2
LV ESV A4C: 34 mL
LV ESV Index A4C: 21 mL/m2
LV Ejection Fraction A4C: 58 %
LV Mass 2D Index: 81.9 g/m2 (ref 43–95)
LV Mass 2D: 132.7 g (ref 67–162)
LV RWT Ratio: 0.47
LVIDd Index: 2.65 cm/m2
LVIDd: 4.3 cm (ref 3.9–5.3)
LVIDs Index: 1.67 cm/m2
LVIDs: 2.7 cm
LVOT Area: 2.5 cm2
LVOT Diameter: 1.8 cm
LVOT Mean Gradient: 1 mmHg
LVOT Peak Gradient: 3 mmHg
LVOT Peak Velocity: 0.9 m/s
LVOT SV: 50.6 ml
LVOT Stroke Volume Index: 31.2 mL/m2
LVOT VTI: 19.9 cm
LVOT:AV VTI Index: 0.66
LVPWd: 1 cm — AB (ref 0.6–0.9)
Left Ventricular Ejection Fraction: 58
MV A Velocity: 0.7 m/s
MV Area by PHT: 3.1 cm2
MV E Velocity: 0.7 m/s
MV E Wave Deceleration Time: 238 ms
MV E/A: 1
MV Mean Gradient: 2 mmHg
MV PHT: 70 ms
MV Peak Gradient: 2 mmHg
RV Free Wall Peak S': 11 cm/s
RVIDd: 3.7 cm
RVSP: 31 mmHg
TAPSE: 2.1 cm (ref 1.7–?)
TR Max Velocity: 2.3 m/s
TR Peak Gradient: 21 mmHg

## 2021-05-15 LAB — CBC WITH AUTO DIFFERENTIAL
Absolute Baso #: 0 10*3/uL (ref 0.0–0.2)
Absolute Eos #: 0 10*3/uL (ref 0.0–0.5)
Absolute Lymph #: 1.1 10*3/uL (ref 1.0–3.2)
Absolute Mono #: 0.5 10*3/uL (ref 0.3–1.0)
Basophils %: 0 % (ref 0.0–2.0)
Eosinophils %: 0 % (ref 0.0–7.0)
Hematocrit: 41.6 % (ref 34.0–47.0)
Hemoglobin: 14.3 g/dL (ref 11.5–15.7)
Immature Grans (Abs): 0.01 10*3/uL (ref 0.00–0.06)
Immature Granulocytes: 0.1 % (ref 0.0–0.6)
Lymphocytes: 15.2 % (ref 15.0–45.0)
MCH: 31.8 pg (ref 27.0–34.5)
MCHC: 34.4 g/dL (ref 32.0–36.0)
MCV: 92.4 fL (ref 81.0–99.0)
MPV: 9.5 fL (ref 7.2–13.2)
Monocytes: 7.2 % (ref 4.0–12.0)
Neutrophils %: 77.5 % — ABNORMAL HIGH (ref 42.0–74.0)
Neutrophils Absolute: 5.8 10*3/uL (ref 1.6–7.3)
Platelets: 153 10*3/uL (ref 140–440)
RBC: 4.5 x10e6/mcL (ref 3.60–5.20)
RDW: 12.2 % (ref 11.0–16.0)
WBC: 7.5 10*3/uL (ref 3.8–10.6)

## 2021-05-15 LAB — CULTURE, BLOOD 1

## 2021-05-15 LAB — POCT GLUCOSE
POC Glucose: 167 mg/dL — ABNORMAL HIGH (ref 65.0–110.0)
POC Glucose: 121 mg/dL — ABNORMAL HIGH (ref 65.0–110.0)
POC Glucose: 123 mg/dL — ABNORMAL HIGH (ref 65.0–110.0)
POC Glucose: 161 mg/dL — ABNORMAL HIGH (ref 65.0–110.0)

## 2021-05-15 LAB — ARTERIAL BLOOD GAS, POC
FIO2: 32 %
PCO2 (TEMP CORRECTED): 33.7 mmHg
PH (TEMPERATURE CORRECTED): 7.429
PO2 (TEMP CORRECTED): 72 mmHg
POC Base Excess: -1 mmol/L (ref <=2.0)
POC CO2: 23 mmol/L (ref 23–27)
POC HCO3: 22.3 mmol/L (ref 22.0–26.0)
POC O2 SAT: 95 % (ref 95.0–97.0)
POC PO2: 72 mmHg — ABNORMAL LOW (ref 80.0–100.0)
POC pCO2: 33.7 mmHg — ABNORMAL LOW (ref 36.0–46.0)
POC pH: 7.429 (ref 7.350–7.450)
Patient Temp: 37 C

## 2021-05-15 MED ORDER — ENOXAPARIN SODIUM 40 MG/0.4ML IJ SOSY
400.4 MG/0.4ML | Freq: Every day | INTRAMUSCULAR | Status: AC
Start: 2021-05-15 — End: 2021-05-19
  Administered 2021-05-15 – 2021-05-19 (×5): 40 mg via SUBCUTANEOUS

## 2021-05-15 MED ORDER — IPRATROPIUM-ALBUTEROL 0.5-2.5 (3) MG/3ML IN SOLN
0.5-2.533 (3) MG/3ML | RESPIRATORY_TRACT | Status: AC | PRN
Start: 2021-05-15 — End: 2021-05-19
  Administered 2021-05-16: 13:00:00 1 via RESPIRATORY_TRACT

## 2021-05-15 MED ORDER — PREDNISONE 20 MG PO TABS
20 MG | Freq: Every day | ORAL | Status: AC
Start: 2021-05-15 — End: 2021-05-15
  Administered 2021-05-15: 14:00:00 40 mg via ORAL

## 2021-05-15 MED ORDER — IPRATROPIUM-ALBUTEROL 0.5-2.5 (3) MG/3ML IN SOLN
RESPIRATORY_TRACT | Status: DC
Start: 2021-05-15 — End: 2021-05-19
  Administered 2021-05-15 – 2021-05-19 (×16): 1 via RESPIRATORY_TRACT

## 2021-05-15 MED ORDER — FUROSEMIDE 10 MG/ML IJ SOLN
10 MG/ML | Freq: Once | INTRAMUSCULAR | Status: AC
Start: 2021-05-15 — End: 2021-05-15
  Administered 2021-05-15: 06:00:00 20 mg via INTRAVENOUS

## 2021-05-15 MED ORDER — ACETAMINOPHEN 650 MG RE SUPP
650 MG | Freq: Four times a day (QID) | RECTAL | Status: DC | PRN
Start: 2021-05-15 — End: 2021-05-19

## 2021-05-15 MED ORDER — LEVOTHYROXINE SODIUM 88 MCG PO TABS
88 MCG | Freq: Every day | ORAL | Status: AC
Start: 2021-05-15 — End: 2021-05-17
  Administered 2021-05-15 – 2021-05-17 (×3): 88 ug via ORAL

## 2021-05-15 MED ORDER — DEXTROSE 10 % IV BOLUS
INTRAVENOUS | Status: AC | PRN
Start: 2021-05-15 — End: 2021-05-19

## 2021-05-15 MED ORDER — IOPAMIDOL 76 % IV SOLN
76 % | Freq: Once | INTRAVENOUS | Status: AC | PRN
Start: 2021-05-15 — End: 2021-05-14
  Administered 2021-05-15: 03:00:00 100 mL via INTRAVENOUS

## 2021-05-15 MED ORDER — ONDANSETRON 4 MG PO TBDP
4 MG | Freq: Three times a day (TID) | ORAL | Status: AC | PRN
Start: 2021-05-15 — End: 2021-05-19

## 2021-05-15 MED ORDER — NORMAL SALINE FLUSH 0.9 % IV SOLN
0.9 % | Freq: Two times a day (BID) | INTRAVENOUS | Status: DC
Start: 2021-05-15 — End: 2021-05-19
  Administered 2021-05-15 – 2021-05-19 (×8): 10 mL via INTRAVENOUS

## 2021-05-15 MED ORDER — GLUCAGON HCL RDNA (DIAGNOSTIC) 1 MG IJ SOLR
1 MG | INTRAMUSCULAR | Status: DC | PRN
Start: 2021-05-15 — End: 2021-05-19

## 2021-05-15 MED ORDER — BUDESONIDE-FORMOTEROL FUMARATE 160-4.5 MCG/ACT IN AERO
Freq: Two times a day (BID) | RESPIRATORY_TRACT | Status: AC
Start: 2021-05-15 — End: 2021-05-19
  Administered 2021-05-15 – 2021-05-19 (×9): 2 via RESPIRATORY_TRACT

## 2021-05-15 MED ORDER — CEFTRIAXONE SODIUM 1 G IJ SOLR
1 g | INTRAMUSCULAR | Status: DC
Start: 2021-05-15 — End: 2021-05-15
  Administered 2021-05-15: 06:00:00 1000 mg via INTRAVENOUS

## 2021-05-15 MED ORDER — GLUCOSE 4 G PO CHEW
4 g | ORAL | Status: AC | PRN
Start: 2021-05-15 — End: 2021-05-19

## 2021-05-15 MED ORDER — SODIUM CHLORIDE 0.9 % IV SOLN
0.9 % | INTRAVENOUS | Status: DC | PRN
Start: 2021-05-15 — End: 2021-05-19

## 2021-05-15 MED ORDER — NORMAL SALINE FLUSH 0.9 % IV SOLN
0.9 % | INTRAVENOUS | Status: AC | PRN
Start: 2021-05-15 — End: 2021-05-19

## 2021-05-15 MED ORDER — NICOTINE 14 MG/24HR TD PT24
1424 MG/24HR | Freq: Every day | TRANSDERMAL | Status: DC
Start: 2021-05-15 — End: 2021-05-19
  Administered 2021-05-16 – 2021-05-19 (×4): 1 via TRANSDERMAL

## 2021-05-15 MED ORDER — DEXTROSE 10 % IV SOLN
10 % | INTRAVENOUS | Status: AC | PRN
Start: 2021-05-15 — End: 2021-05-19

## 2021-05-15 MED ORDER — LOSARTAN POTASSIUM 50 MG PO TABS
50 MG | Freq: Every day | ORAL | Status: AC
Start: 2021-05-15 — End: 2021-05-17
  Administered 2021-05-15 – 2021-05-17 (×3): 50 mg via ORAL

## 2021-05-15 MED ORDER — INSULIN LISPRO 100 UNIT/ML IJ SOLN
100 UNIT/ML | Freq: Three times a day (TID) | INTRAMUSCULAR | Status: AC
Start: 2021-05-15 — End: 2021-05-19

## 2021-05-15 MED ORDER — INSULIN LISPRO 100 UNIT/ML IJ SOLN
100 UNIT/ML | Freq: Every evening | INTRAMUSCULAR | Status: DC
Start: 2021-05-15 — End: 2021-05-19

## 2021-05-15 MED ORDER — TIOTROPIUM BROMIDE MONOHYDRATE 18 MCG IN CAPS
18 MCG | Freq: Every day | RESPIRATORY_TRACT | Status: AC
Start: 2021-05-15 — End: 2021-05-15

## 2021-05-15 MED ORDER — OMEPRAZOLE MAGNESIUM 20 MG PO TBEC
20 MG | Freq: Every day | ORAL | Status: DC
Start: 2021-05-15 — End: 2021-05-14

## 2021-05-15 MED ORDER — OMEPRAZOLE 20 MG PO CPDR
20 MG | Freq: Every day | ORAL | Status: AC
Start: 2021-05-15 — End: 2021-05-19
  Administered 2021-05-15 – 2021-05-19 (×5): 20 mg via ORAL

## 2021-05-15 MED ORDER — FLUTICASONE-SALMETEROL 250-50 MCG/ACT IN AEPB
250-50 MCG/ACT | Freq: Two times a day (BID) | RESPIRATORY_TRACT | Status: DC
Start: 2021-05-15 — End: 2021-05-14

## 2021-05-15 MED ORDER — POLYETHYLENE GLYCOL 3350 17 G PO PACK
17 g | Freq: Every day | ORAL | Status: DC | PRN
Start: 2021-05-15 — End: 2021-05-19

## 2021-05-15 MED ORDER — MONTELUKAST SODIUM 10 MG PO TABS
10 MG | Freq: Every evening | ORAL | Status: AC
Start: 2021-05-15 — End: 2021-05-19
  Administered 2021-05-15 – 2021-05-19 (×5): 10 mg via ORAL

## 2021-05-15 MED ORDER — METHYLPREDNISOLONE SODIUM SUCC 40 MG IJ SOLR
40 MG | Freq: Two times a day (BID) | INTRAMUSCULAR | Status: DC
Start: 2021-05-15 — End: 2021-05-19
  Administered 2021-05-16 – 2021-05-19 (×7): 40 mg via INTRAVENOUS

## 2021-05-15 MED ORDER — ATORVASTATIN CALCIUM 10 MG PO TABS
10 MG | Freq: Every day | ORAL | Status: AC
Start: 2021-05-15 — End: 2021-05-19
  Administered 2021-05-15 – 2021-05-19 (×5): 10 mg via ORAL

## 2021-05-15 MED ORDER — ACETAMINOPHEN 325 MG PO TABS
325 MG | Freq: Four times a day (QID) | ORAL | Status: DC | PRN
Start: 2021-05-15 — End: 2021-05-19
  Administered 2021-05-15 – 2021-05-19 (×11): 650 mg via ORAL

## 2021-05-15 MED ORDER — AZITHROMYCIN 250 MG PO TABS
250 MG | Freq: Every day | ORAL | Status: AC
Start: 2021-05-15 — End: 2021-05-16
  Administered 2021-05-15 – 2021-05-17 (×3): 500 mg via ORAL

## 2021-05-15 MED ORDER — ONDANSETRON HCL 4 MG/2ML IJ SOLN
4 MG/2ML | Freq: Four times a day (QID) | INTRAMUSCULAR | Status: DC | PRN
Start: 2021-05-15 — End: 2021-05-19

## 2021-05-15 MED FILL — TYLENOL 325 MG PO TABS: 325 MG | ORAL | Qty: 2

## 2021-05-15 MED FILL — LEVOTHYROXINE SODIUM 88 MCG PO TABS: 88 MCG | ORAL | Qty: 1

## 2021-05-15 MED FILL — SYMBICORT 160-4.5 MCG/ACT IN AERO: RESPIRATORY_TRACT | Qty: 1

## 2021-05-15 MED FILL — PREDNISONE 20 MG PO TABS: 20 MG | ORAL | Qty: 2

## 2021-05-15 MED FILL — AZITHROMYCIN 250 MG PO TABS: 250 MG | ORAL | Qty: 2

## 2021-05-15 MED FILL — CEFTRIAXONE SODIUM 1 G IJ SOLR: 1 g | INTRAMUSCULAR | Qty: 1000

## 2021-05-15 MED FILL — LOSARTAN POTASSIUM 50 MG PO TABS: 50 MG | ORAL | Qty: 1

## 2021-05-15 MED FILL — NORMAL SALINE FLUSH 0.9 % IV SOLN: 0.9 % | INTRAVENOUS | Qty: 10

## 2021-05-15 MED FILL — DEXTROSE 10 % IV SOLN: 10 % | INTRAVENOUS | Qty: 1000

## 2021-05-15 MED FILL — ATORVASTATIN CALCIUM 20 MG PO TABS: 20 MG | ORAL | Qty: 1

## 2021-05-15 MED FILL — SYMBICORT 160-4.5 MCG/ACT IN AERO: RESPIRATORY_TRACT | Qty: 6

## 2021-05-15 MED FILL — NICOTINE 14 MG/24HR TD PT24: 14 MG/24HR | TRANSDERMAL | Qty: 1

## 2021-05-15 MED FILL — SPIRIVA HANDIHALER 18 MCG IN CAPS: 18 MCG | RESPIRATORY_TRACT | Qty: 5

## 2021-05-15 MED FILL — MONTELUKAST SODIUM 10 MG PO TABS: 10 MG | ORAL | Qty: 1

## 2021-05-15 MED FILL — FUROSEMIDE 10 MG/ML IJ SOLN: 10 MG/ML | INTRAMUSCULAR | Qty: 2

## 2021-05-15 MED FILL — ENOXAPARIN SODIUM 40 MG/0.4ML IJ SOSY: 40 MG/0.4ML | INTRAMUSCULAR | Qty: 0.4

## 2021-05-15 MED FILL — OMEPRAZOLE 20 MG PO CPDR: 20 MG | ORAL | Qty: 1

## 2021-05-15 MED FILL — SPIRIVA HANDIHALER 18 MCG IN CAPS: 18 MCG | RESPIRATORY_TRACT | Qty: 1

## 2021-05-15 MED FILL — IPRATROPIUM-ALBUTEROL 0.5-2.5 (3) MG/3ML IN SOLN: RESPIRATORY_TRACT | Qty: 3

## 2021-05-15 NOTE — Progress Notes (Signed)
Hospitalist Daily Progress Note    Subjective:     Melanie Sandoval Pt feels better overall, breathing has improved. She was weaned down to 2L of nasal canula but she is only at 91% sats with this. She would like to go home but understands she cannot at this time. Daughter notes that she has seen Dr. Charlie Pitter in clinic. She is refusing spiriva for unclear reasons.     Scheduled Meds:   atorvastatin  10 mg Oral Daily    levothyroxine  88 mcg Oral Daily    losartan  50 mg Oral Daily    montelukast  10 mg Oral Nightly    sodium chloride flush  5-40 mL IntraVENous 2 times per day    predniSONE  40 mg Oral Daily    cefTRIAXone (ROCEPHIN) IV  1,000 mg IntraVENous Q24H    azithromycin  500 mg Oral Daily    enoxaparin  40 mg SubCUTAneous Daily    budesonide-formoterol  2 puff Inhalation BID    omeprazole  20 mg Oral Daily    nicotine  1 patch TransDERmal Daily    insulin lispro  0-4 Units SubCUTAneous TID WC    insulin lispro  0-4 Units SubCUTAneous Nightly     Continuous Infusions:   sodium chloride      dextrose       PRN Meds:albuterol, sodium chloride flush, sodium chloride, ondansetron **OR** ondansetron, polyethylene glycol, acetaminophen **OR** acetaminophen, ipratropium-albuterol, glucose, dextrose bolus **OR** dextrose bolus, glucagon (rDNA), dextrose      Objective:      Physical Exam:  Gen: NAD, alert and answers questions appropriately  Cardio: RRR no murmurs, rubs or gallops  Pulmonary: Non labored breathing, crackles at the bases  Abdomen: Soft, non-tender, non-distended, normal bowel sounds, no rebound or guarding  Ext: no edema, 2+ pedal pulses         Lab Review   Lab Results   Component Value Date/Time    NA 135 05/14/2021 03:32 PM    NA 137 02/16/2021 03:09 PM    NA 136 11/15/2020 06:34 PM    K 4.3 05/14/2021 03:32 PM    K 4.6 02/16/2021 03:09 PM    K 4.1 11/15/2020 06:34 PM    CO2 21 05/14/2021 03:32 PM    CO2 24 02/16/2021 03:09 PM    CO2 23 11/15/2020 06:34 PM    BUN 13 05/14/2021 03:32 PM    BUN 13  02/16/2021 03:09 PM    BUN 16 11/15/2020 06:34 PM    CREATININE 0.7 05/14/2021 03:32 PM    CREATININE 0.9 02/16/2021 03:09 PM    CREATININE 0.7 11/15/2020 06:34 PM    GLUCOSE 170 05/14/2021 03:32 PM    GLUCOSE 70 02/16/2021 03:09 PM    GLUCOSE 94 11/15/2020 06:34 PM    CALCIUM 9.3 05/14/2021 03:32 PM    CALCIUM 9.8 02/16/2021 03:09 PM    CALCIUM 9.6 11/15/2020 06:34 PM     Lab Results   Component Value Date/Time    WBC 7.5 05/15/2021 06:41 AM    WBC 4.7 05/14/2021 03:32 PM    WBC 6.9 11/15/2020 06:34 PM    HGB 14.3 05/15/2021 06:41 AM    HGB 14.3 05/14/2021 03:32 PM    HGB 13.8 11/15/2020 06:34 PM    HCT 41.6 05/15/2021 06:41 AM    HCT 41.3 05/14/2021 03:32 PM    HCT 39.5 11/15/2020 06:34 PM    MCV 92.4 05/15/2021 06:41 AM    MCV 92.0 05/14/2021 03:32  PM    MCV 94.0 11/15/2020 06:34 PM    PLT 153 05/15/2021 06:41 AM    PLT 133 05/14/2021 03:32 PM    PLT 162 11/15/2020 06:34 PM       Assessment:   65 y.o. 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     1.  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  She is on triple inhaler therapy with Advair and Spiriva (though unfortunately  refusing this now) but not on any antifibrotic agent.  She is requiring 2 L nasal cannula today.  She was influenza, COVID-negative. CT PE shows chronic lung disease as above, no new consolidations  -Obtain RF, anti-CCP, ANA with reflex. ANA previously negative in 2/22  -Continue supplemental oxygen to maintain sats greater than 88%  -Her NT proBNP was elevated, will obtain TTE to screen for heart failure and pulmonary hypertension. This is still pending  -Continue steroids. Will increase to solumedrol 40 BID for now. Can probably downgrade to prednisone daily tomorrow  -Follow infectious work-up with blood cultures, sputum culture  -DC ceftriaxone as no evidence of infection. Continue azithro for COPD exacerbation  -DVT prophylaxis with Lovenox 40 mg subcutaneous  -Keep net even/net negative fluid balance  -Resume home Singulair  -DuoNebs scheduled  -Continue home Advair. She is refusing spiriva  -She follows with Dr. Charlie Pitter regularly. Hope to wean her off oxygen before she goes back home.    2.  History of hypothyroidism  -Check TSH with reflex  -Continue home Synthroid    3.  History of hypertension  -Resume home losartan 50 mg daily    4.  GERD  -Continue home Prilosec    5.  Nicotine dependence  -Start nicotine patch 14 mg daily    6.  Hypomagnesemia  -Recheck magnesium in the morning, got 1 g of mag in the ED today.      7.  Hyperglycemia  -Fingersticks 3 times daily AC at bedtime  -Checked A1c.  -Sliding scale insulin.        Diet: ADULT DIET; Regular   DVT prophylaxis: Lovenox SC  Code status: Full Code   Disposition: Inpatient      For any questions, please contact Team C

## 2021-05-15 NOTE — Progress Notes (Signed)
[facility] [place] [id number]  Phone: [phone]  Fax: [phone]  Acute Care Physical Therapy Evaluation    (Link to Caseload Tracking PT Visit Days : 1  Acknowledge Orders   Time In/Out   PT Charge Capture   Rehab Caseload Tracker    Therapy Minute Tracking       Time In: 1115  Time Out: 1128  Minutes: Melanie Sandoval is a 65 y.o. female   PRIMARY DIAGNOSIS Acute respiratory failure with hypoxia (HCC)  COPD exacerbation ([place]) [J44.1]  Acute respiratory failure with hypoxia ([place]) [J96.01]  Acute respiratory failure with hypoxemia ([place]) [J96.01]       Reason for Referral Other abnormalities of gait and mobility (R26.89)  Inpatient Payor: [employer] MEDICARE / Plan: [place] / Product Type: *No Product type* /     SUBJECTIVE   Melanie Sandoval states she has been up walking around with no mobility issues. Her daughter is present and states that pt 02 sats dropped to 88% when she was up in room going to restroom and washing her hands.      Social/Functional Lives With: Alone  Type of Home: House  Home Layout: One level  Home Access: Stairs to enter with rails  Entrance Stairs - Number of Steps: 4  Ambulation Assistance: Independent  Transfer Assistance: Independent    [place] / PAIN / OXYGEN LINES / DRAINS / PRECAUTIONS   Vital Signs    Seated BP 158/99   Standing BP 136/91  HR with ambulation 62f 80bpm  02 ambulating 362f93% on 2L nasal cannula    Oxygen   2L nasal cannula     Pain - Pre Treatment Pain Assessment: None - Denies Pain   Telemetry     Precautions/Restrictions      Orthostatic               GROSS EVALUATION COMMENTS   AROM     PROM      Strength     Balance  WNL   Posture     Sensation     Coordination      Tone     Edema     Activity Tolerance       COGNITION / PERCEPTION COMMENTS   Orientation  WNL   Cognition Overall Cognitive Status: WNLWNL   Vision  WNL   Hearing  WNL     MOBILITY LEVEL 1 2 Complete  I Mod I  Supervision Setup CGA Min A Mod A Max A Total A Does Not Occur   Roll Left '[]'  '[]'  '[]'  '[]'  '[]'  '[]'  '[]'  '[]'  '[]'  '[]'  '[]'  '[]'    Roll Right '[]'  '[]'  '[]'  '[]'  '[]'  '[]'  '[]'  '[]'  '[]'  '[]'  '[]'  '[]'    Supine to Sit '[]'  '[]'  '[]'  '[]'  '[]'  '[]'  '[]'  '[]'  '[]'  '[]'  '[]'  '[]'    Sit to Supine '[]'  '[]'  '[]'  '[]'  '[]'  '[]'  '[]'  '[]'  '[]'  '[]'  '[]'  '[]'    Scooting '[]'  '[]'  '[]'  '[]'  '[x]'  '[]'  '[]'  '[]'  '[]'  '[]'  '[]'  '[]'    Edge of Bed Assist '[]'  '[]'  '[]'  '[]'  '[]'  '[]'  '[]'  '[]'  '[]'  '[]'  '[]'  '[]'    Transfer Sit to Stand '[]'  '[]'  '[]'  '[]'  '[x]'  '[]'  '[]'  '[]'  '[]'  '[]'  '[]'  '[]'    Transfer Stand to Sit '[]'  '[]'  '[]'  '[]'  '[x]'  '[]'  '[]'  '[]'  '[]'  '[]'  '[]'  '[]'    Transfer Bed  to/from Chair '[]'  '[]'  '[]'  '[]'  '[]'  '[]'  '[]'  '[]'  '[]'  '[]'  '[]'  '[]'    Transfer Toilet '[]'  '[]'  '[]'  '[]'  '[]'  '[]'  '[]'  '[]'  '[]'  '[]'  '[]'  '[]'     '[]'  '[]'  '[]'  '[]'  '[]'  '[]'  '[]'  '[]'  '[]'  '[]'  '[]'  '[]'      Mobility Comments        AMBULATION LEVEL 1 2 Complete  I Mod I Supervision Setup CGA Min A Mod A Max A Total A Does Not Occur   Level of Assistance '[]'  '[]'  '[]'  '[]'  '[x]'  '[]'  '[]'  '[]'  '[]'  '[]'  '[]'       AMBULATION  Distance 59f feet   DME None   Gait Quality N/A   Stairs Stairs/Curb  Stairs?: No     Ambulation Comments   Pt reported feeling lightheaded after 30 ft amb in room.      ASSESSMENT      POST ACUTE RECOMMENDATIONS   Recommendation to date pending progress   Setting No further skilled therapy after discharge from hospital    Equipment : Pt may need home oxygen pending improvement in vitals.     ASSESSMENT:  Melanie Sandoval with COPD exacerbation and SOB. She demonstrates decreased endurance/activity tolerance. Today pt ambulated 30 ft in room on 2L, with no AD, and with supervision. She then reported feeling lightheaded so pt sat down and seated and standing BP taken. Pt was slightly orthostatic (see vitals above) so mobility discontinued and pt left in chair with needs met. Pt would benefit from further mobility assessment to assess 02 levels with further amb once BP levels normalize.      Melanie Sandoval?? ???6 Clicks??? Basic Mobility Inpatient Short Form  AM-PAC Mobility Inpatient   How much difficulty turning over in bed?: None  How much difficulty sitting down on /  standing up from a chair with arms?: None  How much difficulty moving from lying on back to sitting on side of bed?: None  How much help from another person moving to and from a bed to a chair?: None  How much help from another person needed to walk in hospital room?: None  How much help from another person for climbing 3-5 steps with a railing?: None  AM-PAC Inpatient Mobility Raw Score : 24  AM-PAC Inpatient T-Scale Score : 61.14  Mobility Inpatient CMS 0-100% Score: 0  Mobility Inpatient CMS G-Code Modifier : CH     FREQUENCY AND DURATION Daily for duration of hospital stay or until stated goals are met, whichever comes first.    THERAPY PROGNOSIS Excellent    PROBLEM LIST  (Skilled intervention is medically necessary to address:)  Decreased Activity Tolerance INTERVENTIONS PLANNED  (Benefits and precautions of physical therapy have been discussed with the patient.)  Therapeutic Activity  Therapeutic Exercise/HEP  Gait Training  Education     Goals      Time Frame for Short Term Goals: 3 days  Short Term Goal 1: Pt will perform all transfers independently  Short Term Goal 2: Pt will ambulate 100 ft with supervision and 02 >90%  Time Frame for Long Term Goals : 10 days  Long Term Goal 1: Pt will ambulate 200 ft with 02>90% and independently  Long Term Goal 2: Pt will perform 4 steps with rail use and 02 >90% independently    TREATMENT   EVALUATION LOW COMPLEXITY        SAFETY Needs within reach    Education Education Given To: Patient;Family  Education Provided: Role of Therapy;Plan  of Care  Education Method: Verbal  Barriers to Learning: None  Education Outcome: Verbalized understanding

## 2021-05-16 LAB — HEMOGLOBIN A1C
Est. Avg. Glucose, WB: 100
Est. Avg. Glucose-calculated: 104
Hemoglobin A1C: 5.1 % (ref 4.0–6.0)

## 2021-05-16 LAB — COMPREHENSIVE METABOLIC PANEL W/ REFLEX TO MG FOR LOW K
ALT: 19 U/L (ref 0–35)
AST: 17 U/L (ref 0–35)
Albumin/Globulin Ratio: 2 (ref 1.00–2.70)
Albumin: 4.4 g/dL (ref 3.5–5.2)
Alk Phosphatase: 79 U/L (ref 35–117)
Anion Gap: 13 mmol/L (ref 2–17)
BUN: 20 mg/dL (ref 8–23)
CO2: 24 mmol/L (ref 22–29)
Calcium: 9.6 mg/dL (ref 8.8–10.2)
Chloride: 100 mmol/L (ref 98–107)
Creatinine: 0.9 mg/dL (ref 0.5–1.0)
Est, Glom Filt Rate: 71 mL/min/1.73m?? — ABNORMAL LOW (ref >=90)
Globulin: 3 g/dL (ref 1.9–4.4)
Glucose: 144 mg/dL — ABNORMAL HIGH (ref 70–99)
OSMOLALITY CALCULATED: 279 mOsm/kg (ref 270–287)
Potassium: 4.7 mmol/L (ref 3.5–5.3)
Sodium: 137 mmol/L (ref 135–145)
Total Bilirubin: 0.5 mg/dL (ref 0.00–1.20)
Total Protein: 7 g/dL (ref 6.4–8.3)

## 2021-05-16 LAB — POCT GLUCOSE
POC Glucose: 157 mg/dL — ABNORMAL HIGH (ref 65.0–110.0)
POC Glucose: 113 mg/dL — ABNORMAL HIGH (ref 65.0–110.0)
POC Glucose: 123 mg/dL — ABNORMAL HIGH (ref 65.0–110.0)
POC Glucose: 114 mg/dL — ABNORMAL HIGH (ref 65.0–110.0)

## 2021-05-16 LAB — PHOSPHORUS: Phosphorus: 4.9 mg/dL — ABNORMAL HIGH (ref 2.5–4.5)

## 2021-05-16 LAB — TSH WITH REFLEX: TSH: 0.953 mcIU/mL (ref 0.358–3.740)

## 2021-05-16 MED FILL — NORMAL SALINE FLUSH 0.9 % IV SOLN: 0.9 % | INTRAVENOUS | Qty: 10

## 2021-05-16 MED FILL — TYLENOL 325 MG PO TABS: 325 MG | ORAL | Qty: 2

## 2021-05-16 MED FILL — OMEPRAZOLE 20 MG PO CPDR: 20 MG | ORAL | Qty: 1

## 2021-05-16 MED FILL — SOLU-MEDROL 40 MG IJ SOLR: 40 MG | INTRAMUSCULAR | Qty: 40

## 2021-05-16 MED FILL — ATORVASTATIN CALCIUM 10 MG PO TABS: 10 MG | ORAL | Qty: 1

## 2021-05-16 MED FILL — ENOXAPARIN SODIUM 40 MG/0.4ML IJ SOSY: 40 MG/0.4ML | INTRAMUSCULAR | Qty: 0.4

## 2021-05-16 MED FILL — AZITHROMYCIN 250 MG PO TABS: 250 MG | ORAL | Qty: 2

## 2021-05-16 MED FILL — IPRATROPIUM-ALBUTEROL 0.5-2.5 (3) MG/3ML IN SOLN: RESPIRATORY_TRACT | Qty: 3

## 2021-05-16 MED FILL — LOSARTAN POTASSIUM 50 MG PO TABS: 50 MG | ORAL | Qty: 1

## 2021-05-16 MED FILL — MONTELUKAST SODIUM 10 MG PO TABS: 10 MG | ORAL | Qty: 1

## 2021-05-16 MED FILL — LEVOTHYROXINE SODIUM 88 MCG PO TABS: 88 MCG | ORAL | Qty: 1

## 2021-05-16 MED FILL — NICOTINE 14 MG/24HR TD PT24: 14 MG/24HR | TRANSDERMAL | Qty: 1

## 2021-05-16 NOTE — Progress Notes (Signed)
[facility] Hospitalist Service     Hospitalist Progress Note     Admission Date: 05/14/2021  2:58 PM   LOS: 2   PCP: Tera Helper, MD      Summary:  65 y.o. female admitted with  SOB in setting of underlying chronic lung disease. Remains on supplemental o2, which she does not wear at home.      Subjective    Continues to improve a little each day, but still remains on o2. SpO2 down to 80s on 2lpm with ambulation per RN. Low 90s at rest.      Physical Exam   Vitals: BP (!) 146/85    Pulse 67    Temp 97.9 ??F (36.6 ??C) (Oral)    Resp 16    Ht 5\' 2"  (1.575 m)    Wt 134 lb 7.7 oz (61 kg)    SpO2 94%    BMI 24.60 kg/m?? Temp (24hrs), Avg:98.1 ??F (36.7 ??C), Min:97.9 ??F (36.6 ??C), Max:98.2 ??F (36.8 ??C)    General: NAD, nontoxic appearing  Cardiovascular: rrr, no mrg  Respiratory: diminished, breathing reg and unlabored   Gastrointestinal: ABD soft, nt, nd  Skin: warm and dry, no rash visualized  Neuro: alert and oriented, no focality  Psych: appropriate mood and affect     Labs and Imaging     CBC with Differential:    Lab Results   Component Value Date/Time    WBC 7.5 05/15/2021 06:41 AM    RBC 4.50 05/15/2021 06:41 AM    HGB 14.3 05/15/2021 06:41 AM    HCT 41.6 05/15/2021 06:41 AM    PLT 153 05/15/2021 06:41 AM    MCV 92.4 05/15/2021 06:41 AM    MCH 31.8 05/15/2021 06:41 AM    MCHC 34.4 05/15/2021 06:41 AM    RDW 12.2 05/15/2021 06:41 AM    LYMPHOPCT 15.2 05/15/2021 06:41 AM    MONOPCT 7.2 05/15/2021 06:41 AM    BASOPCT 0.0 05/15/2021 06:41 AM    MONOSABS 0.5 05/15/2021 06:41 AM    LYMPHSABS 1.1 05/15/2021 06:41 AM    EOSABS 0.0 05/15/2021 06:41 AM    BASOSABS 0.0 05/15/2021 06:41 AM     CMP:    Lab Results   Component Value Date/Time    NA 137 05/16/2021 06:03 AM    K 4.7 05/16/2021 06:03 AM    CL 100 05/16/2021 06:03 AM    CO2 24 05/16/2021 06:03 AM    BUN 20 05/16/2021 06:03 AM    CREATININE 0.9 05/16/2021 06:03 AM    GFRAA 105 11/15/2020 06:34 PM    LABGLOM 71 05/16/2021 06:03 AM    GLUCOSE 144 05/16/2021 06:03 AM     PROT 7.0 05/16/2021 06:03 AM    LABALBU 4.4 05/16/2021 06:03 AM    CALCIUM 9.6 05/16/2021 06:03 AM    BILITOT 0.50 05/16/2021 06:03 AM    ALKPHOS 79 05/16/2021 06:03 AM    AST 17 05/16/2021 06:03 AM    ALT 19 05/16/2021 06:03 AM       Imaging  CTA PULMONARY W CONTRAST    Result Date: 05/15/2021  CTA chest: 05/14/21 INDICATION: "R/O PE. Patient with underlying ILD, presents with SOB". COMPARISON: CT 02/16/2021 TECHNIQUE: PE protocol (Postcontrast imaging from the thoracic inlet through the  hemidiaphragms in the pulmonary arterial phase. Axial 5x5 mm soft tissue and lung 2x2 mm images. Multiplanar 3-D volumetric MIP reconstructions through the pulmonary arteries per protocol.) CT scanning was performed using radiation dose  reduction  techniques when appropriate, per system protocols. FINDINGS: Pulmonary arteries: No evidence of a pulmonary embolus. Lungs/Airways: There are basilar predominant subpleural reticular opacities bilaterally, similar to prior. Mild honeycombing in the bases. Mild traction bronchiectasis in the lower lobes. There is a background of confluent centrilobular emphysema and groundglass opacities. No new focal consolidation. There are a few sub-5 mm pulmonary nodules, similar to prior. Pleura: No pleural effusion or pneumothorax. Lymph Nodes: No mediastinal, hilar, or axillary adenopathy. Cardiovascular: Mild cardiomegaly. No pericardial effusion. Three-vessel coronary artery calcifications. Osseous structures: No suspicious lytic or blastic osseous lesions. Upper Abdomen: Small hiatal hernia.     No evidence of pulmonary embolus. 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 There is  a background of confluent centrilobular emphysema. No new focal consolidation.    Transthoracic echocardiogram (TTE) complete with contrast, bubble, strain, and 3D PRN    Result Date: 05/15/2021  ???  Left Ventricle: Normal left ventricular systolic function with a visually estimated EF of 55 -  60%. Left ventricle size is normal. Normal wall thickness. Normal wall motion. Normal diastolic function. ???  Right Ventricle: Right ventricle is mildly dilated. Normal wall thickness. ???  Aortic Valve: Tricuspid valve. ???  Tricuspid Valve: Mild regurgitation with a centrally directed jet.        Medications   Inpatient Meds:  Scheduled  ??? ipratropium-albuterol  1 ampule Inhalation Q4H WA   ??? methylPREDNISolone  40 mg IntraVENous Q12H   ??? atorvastatin  10 mg Oral Daily   ??? levothyroxine  88 mcg Oral Daily   ??? losartan  50 mg Oral Daily   ??? montelukast  10 mg Oral Nightly   ??? sodium chloride flush  5-40 mL IntraVENous 2 times per day   ??? azithromycin  500 mg Oral Daily   ??? enoxaparin  40 mg SubCUTAneous Daily   ??? budesonide-formoterol  2 puff Inhalation BID   ??? omeprazole  20 mg Oral Daily   ??? nicotine  1 patch TransDERmal Daily   ??? insulin lispro  0-4 Units SubCUTAneous TID WC   ??? insulin lispro  0-4 Units SubCUTAneous Nightly     PRN  albuterol, 15 mg/hr, Nebulization, Q6H PRN  sodium chloride flush, 5-40 mL, IntraVENous, PRN  sodium chloride, , IntraVENous, PRN  ondansetron, 4 mg, Oral, Q8H PRN   Or  ondansetron, 4 mg, IntraVENous, Q6H PRN  polyethylene glycol, 17 g, Oral, Daily PRN  acetaminophen, 650 mg, Oral, Q6H PRN   Or  acetaminophen, 650 mg, Rectal, Q6H PRN  ipratropium-albuterol, 1 ampule, Inhalation, Q4H PRN  glucose, 4 tablet, Oral, PRN  dextrose bolus, 125 mL, IntraVENous, PRN   Or  dextrose bolus, 250 mL, IntraVENous, PRN  glucagon (rDNA), 1 mg, SubCUTAneous, PRN  dextrose, , IntraVENous, Continuous PRN       Continuous  ??? sodium chloride     ??? dextrose         Home Meds:  Prior to Admission medications    Medication Sig Start Date End Date Taking? Authorizing Provider   magnesium oxide (MAG-OX) 400 MG tablet Take 400 mg by mouth daily   Yes Historical Provider, MD   Cholecalciferol (VITAMIN D) 50 MCG (2000 UT) CAPS capsule Take 2,000 Units by mouth daily   Yes Historical Provider, MD   omeprazole  (PRILOSEC OTC) 20 MG tablet Take 20 mg by mouth daily   Yes Historical Provider, MD   albuterol sulfate HFA (PROVENTIL;VENTOLIN;PROAIR) 108 (90 Base) MCG/ACT inhaler 1 puff as needed Inhalation  every 4 hrs    Historical Provider, MD   atorvastatin (LIPITOR) 10 MG tablet Take 10 mg by mouth daily 04/06/21   Historical Provider, MD   fluticasone-salmeterol (ADVAIR) 250-50 MCG/ACT AEPB diskus inhaler Inhale into the lungs 2 times daily 05/06/21   Historical Provider, MD   hydrOXYzine HCl (ATARAX) 25 MG tablet Take 25 mg by mouth every 8 hours as needed 11/15/20   Historical Provider, MD   losartan (COZAAR) 50 MG tablet Take 50 mg by mouth daily 04/06/21   Historical Provider, MD   montelukast (SINGULAIR) 10 MG tablet 1 tablet Orally Once a day for 30 day(s)    Historical Provider, MD   tiotropium (SPIRIVA HANDIHALER) 18 MCG inhalation capsule 1 capsule by inhaling the contents of the capsule using the handihaler device Inhalation Once a day  Patient not taking: No sig reported    Historical Provider, MD   amoxicillin-clavulanate (AUGMENTIN) 875-125 MG per tablet Take 1 tablet by mouth in the morning and 1 tablet in the evening. Do all this for 10 days. 05/14/21 05/24/21  Clovia Cuff, APRN - NP   predniSONE (DELTASONE) 20 MG tablet Take 2 tablets by mouth daily for 5 days 05/14/21 05/19/21  Clovia Cuff, APRN - NP   azithromycin (ZITHROMAX) 250 MG tablet Take 1 tablet by mouth See Admin Instructions for 5 days 500mg  on day 1 followed by 250mg  on days 2 - 5 05/14/21 05/19/21  13/6/22, APRN - NP   levothyroxine (SYNTHROID) 88 MCG tablet 1 tablet daily by mouth 03/16/21   Clovia Cuff, MD   montelukast (SINGULAIR) 10 MG tablet Take 1 tablet by mouth nightly 03/16/21 06/14/21  05/16/21, MD         Assessment and Plan   Acute hypoxic respiratory failure  CPFE (combined pulmonary fibrosis/emphysema)  Chest x-ray corroborates findings noted on prior CT scan from August.  She is on triple inhaler therapy with  Advair and Spiriva (though unfortunately refusing this now). She was influenza, COVID-negative. CT PE shows chronic lung disease as above, no new consolidations. TTE w/ normal EF. Continues to require 2lpm o2 and desaturated to 80s w/ ambulation. Continue steroids. Consult pulmonology w/ persistent hypoxia in setting of significant underlying lung disease.     Nicotine dependence  Continue nicotine patch, needs to quit smoking.    HTN  Continue home losartan 50 mg daily    GERD  Continue home Prilosec    History of hypothyroidism  TSH WNL      VTE prophylaxis  SQ Lovenox    Disposition  Discharge plan: Home when able to wean o2. May end up needing home o2, await pulm recs.         Lydia Guiles, APRN - NP  05/16/2021 4:29 PM  [facility] Hospitalist Service

## 2021-05-16 NOTE — Progress Notes (Signed)
PATIENT AMBULATING OUT OF BR WITH DAUGHTER UPON ARRIVAL TO ROOM, 02 APPLIED. CHECKED 02 SATS IN CHAIR = 87% - 92%. PER DAUGHTER, WHO IS AN RN, PATIENT "TOOK A LONG SHOWER AND WASHED HER HAIR". PATIENT C/O FATIGUE BUT PROGRESSING WITH MOBILITY. NO DIZZINESS OR BP ISSUES TODAY PER PATIENT AND DAUGHTER. WILL FOLLOW.

## 2021-05-17 LAB — EKG 12-LEAD
P Axis: 56 degrees
P-R Interval: 140 ms
Q-T Interval: 426 ms
QRS Duration: 97 ms
QTc Calculation (Bazett): 471 ms
R Axis: 52 degrees
T Axis: -12 degrees
Ventricular Rate: 73 {beats}/min

## 2021-05-17 LAB — BASIC METABOLIC PANEL
Anion Gap: 10 mmol/L (ref 2–17)
BUN: 20 mg/dL (ref 8–23)
CO2: 24 mmol/L (ref 22–29)
Calcium: 9.6 mg/dL (ref 8.8–10.2)
Chloride: 101 mmol/L (ref 98–107)
Creatinine: 0.9 mg/dL (ref 0.5–1.0)
Est, Glom Filt Rate: 71 mL/min/1.73m?? — ABNORMAL LOW (ref >=90)
Glucose: 147 mg/dL — ABNORMAL HIGH (ref 70–99)
OSMOLALITY CALCULATED: 275 mOsm/kg (ref 270–287)
Potassium: 5.4 mmol/L — ABNORMAL HIGH (ref 3.5–5.3)
Sodium: 135 mmol/L (ref 135–145)

## 2021-05-17 LAB — CBC
Hematocrit: 43.3 % (ref 34.0–47.0)
Hemoglobin: 14.5 g/dL (ref 11.5–15.7)
MCH: 32.2 pg (ref 27.0–34.5)
MCHC: 33.5 g/dL (ref 32.0–36.0)
MCV: 96 fL (ref 81.0–99.0)
MPV: 9.6 fL (ref 7.2–13.2)
Platelets: 160 10*3/uL (ref 140–440)
RBC: 4.51 x10e6/mcL (ref 3.60–5.20)
RDW: 12.3 % (ref 11.0–16.0)
WBC: 7.1 10*3/uL (ref 3.8–10.6)

## 2021-05-17 LAB — MAGNESIUM: Magnesium: 1.7 mg/dL (ref 1.6–2.6)

## 2021-05-17 LAB — POCT GLUCOSE
POC Glucose: 111 mg/dL — ABNORMAL HIGH (ref 65.0–110.0)
POC Glucose: 127 mg/dL — ABNORMAL HIGH (ref 65.0–110.0)
POC Glucose: 119 mg/dL — ABNORMAL HIGH (ref 65.0–110.0)
POC Glucose: 151 mg/dL — ABNORMAL HIGH (ref 65.0–110.0)

## 2021-05-17 MED ORDER — LOSARTAN POTASSIUM 50 MG PO TABS
50 MG | Freq: Every day | ORAL | Status: DC
Start: 2021-05-17 — End: 2021-05-19
  Administered 2021-05-17 – 2021-05-19 (×3): 50 mg via ORAL

## 2021-05-17 MED ORDER — ALPRAZOLAM 0.25 MG PO TABS
0.25 MG | Freq: Every evening | ORAL | Status: DC | PRN
Start: 2021-05-17 — End: 2021-05-19

## 2021-05-17 MED ORDER — LEVOTHYROXINE SODIUM 88 MCG PO TABS
88 MCG | Freq: Every day | ORAL | Status: DC
Start: 2021-05-17 — End: 2021-05-19
  Administered 2021-05-18 – 2021-05-19 (×2): 88 ug via ORAL

## 2021-05-17 MED FILL — TYLENOL 325 MG PO TABS: 325 MG | ORAL | Qty: 2

## 2021-05-17 MED FILL — IPRATROPIUM-ALBUTEROL 0.5-2.5 (3) MG/3ML IN SOLN: RESPIRATORY_TRACT | Qty: 3

## 2021-05-17 MED FILL — LOSARTAN POTASSIUM 50 MG PO TABS: 50 MG | ORAL | Qty: 1

## 2021-05-17 MED FILL — NORMAL SALINE FLUSH 0.9 % IV SOLN: 0.9 % | INTRAVENOUS | Qty: 10

## 2021-05-17 MED FILL — NICOTINE 14 MG/24HR TD PT24: 14 MG/24HR | TRANSDERMAL | Qty: 1

## 2021-05-17 MED FILL — MONTELUKAST SODIUM 10 MG PO TABS: 10 MG | ORAL | Qty: 1

## 2021-05-17 MED FILL — OMEPRAZOLE 20 MG PO CPDR: 20 MG | ORAL | Qty: 1

## 2021-05-17 MED FILL — SOLU-MEDROL 40 MG IJ SOLR: 40 MG | INTRAMUSCULAR | Qty: 40

## 2021-05-17 MED FILL — LEVOTHYROXINE SODIUM 88 MCG PO TABS: 88 MCG | ORAL | Qty: 1

## 2021-05-17 MED FILL — ENOXAPARIN SODIUM 40 MG/0.4ML IJ SOSY: 40 MG/0.4ML | INTRAMUSCULAR | Qty: 0.4

## 2021-05-17 MED FILL — ATORVASTATIN CALCIUM 10 MG PO TABS: 10 MG | ORAL | Qty: 1

## 2021-05-17 NOTE — Progress Notes (Addendum)
Comprehensive Nutrition Assessment    Type and Reason for Visit:    Positive nutrition screen, initial    Nutrition Recommendations/Plan:   Continue current diet. 75% intake of meals on average meets 98%EER/100%EPR  Offer Ensure High Protein Plus prn if meal intake is poor.         Nutrition Assessment:    65 y/o F history of COPD, HTN, hyperlipidemia, thyroid surgery, nicotine dependence. Admitted with Acute hypoxic respiratory failure  CPFE (combined pulmonary fibrosis/emphysema). RD assessing remotely. LBM 11/8; watery per documentation. Eating fair-well: 51-100% intake with 2 meals documented. Hyperglycemia on steroids-A1c 5.1    Nutrition Related Findings:      Wound Type: None       Current Nutrition Intake & Therapies:    Average Meal Intake: 51-75%, 76-100%  Average Supplements Intake: None Ordered  ADULT DIET; Regular    Anthropometric Measures:  Height: 5\' 2"  (157.5 cm)  Ideal Body Weight (IBW): 110 lbs (50 kg)    Admission Body Weight: 141 lb 15.6 oz (64.4 kg)  Current Body Weight: 141 lb 15.6 oz (64.4 kg), 129.1 % IBW.    Current BMI (kg/m2): 26  Usual Body Weight: 147 lb 11.3 oz (67 kg)  % Weight Change (Calculated): -3.9  Weight Adjustment For: No Adjustment                 BMI Categories: Overweight (BMI 25.0-29.9)    Estimated Daily Nutrient Needs:  Energy Requirements Based On: Formula     Energy (kcal/day): 1486  Weight Used for Protein Requirements: Current  Protein (g/day): 77  Method Used for Fluid Requirements: ml/Kg  Carbs: 186g (50%EER)  Fluid (ml/day): 05-11-2001    Nutrition Diagnosis:   No nutrition diagnosis at this time     Nutrition Interventions:   Food and/or Nutrient Delivery: Continue Current Diet  Nutrition Education/Counseling: No recommendation at this time  Coordination of Nutrition Care: Continue to monitor while inpatient       Goals:     Goals: Meet at least 75% of estimated needs       Nutrition Monitoring and Evaluation:      Food/Nutrient Intake Outcomes: Food and Nutrient  Intake, Other (Comment) (Supplement needs)  Physical Signs/Symptoms Outcomes: Skin, Weight    Discharge Planning:    Continue current diet. ONS prn     Ellianna Ruest, RD  Contact: [place] Carlinda Ohlson, RD  [facility]  Office: [phone]  [employer]

## 2021-05-17 NOTE — Progress Notes (Signed)
[facility] Hospitalist Service     Hospitalist Progress Note     Admission Date: 05/14/2021  2:58 PM   LOS: 3   PCP: Tera Helper, MD      Summary:  65 y.o. female admitted with  SOB in setting of underlying chronic lung disease. Remains on supplemental o2, which she does not wear at home.      Subjective   Remains on o2, states o2 goes down when she ambulates and coughs. Didn't get much sleep last night, tearful this morning. Worried about her overall prognosis. She quit smoking last week.      Physical Exam   Vitals: BP (!) 157/84    Pulse 71    Temp 98 ??F (36.7 ??C) (Oral)    Resp 18    Ht 5\' 2"  (1.575 m)    Wt 141 lb 15.6 oz (64.4 kg)    SpO2 90%    BMI 25.97 kg/m?? Temp (24hrs), Avg:97.9 ??F (36.6 ??C), Min:97.7 ??F (36.5 ??C), Max:98 ??F (36.7 ??C)    General: NAD, nontoxic appearing  Cardiovascular: rrr, no mrg  Respiratory: diminished w/ slight wheeze, breathing reg and unlabored   Gastrointestinal: ABD soft, nt, nd  Skin: warm and dry, no rash visualized  Neuro: alert and oriented, no focality  Psych: appropriate mood and affect     Labs and Imaging     CBC with Differential:    Lab Results   Component Value Date/Time    WBC 7.1 05/17/2021 06:05 AM    RBC 4.51 05/17/2021 06:05 AM    HGB 14.5 05/17/2021 06:05 AM    HCT 43.3 05/17/2021 06:05 AM    PLT 160 05/17/2021 06:05 AM    MCV 96.0 05/17/2021 06:05 AM    MCH 32.2 05/17/2021 06:05 AM    MCHC 33.5 05/17/2021 06:05 AM    RDW 12.3 05/17/2021 06:05 AM    LYMPHOPCT 15.2 05/15/2021 06:41 AM    MONOPCT 7.2 05/15/2021 06:41 AM    BASOPCT 0.0 05/15/2021 06:41 AM    MONOSABS 0.5 05/15/2021 06:41 AM    LYMPHSABS 1.1 05/15/2021 06:41 AM    EOSABS 0.0 05/15/2021 06:41 AM    BASOSABS 0.0 05/15/2021 06:41 AM     CMP:    Lab Results   Component Value Date/Time    NA 135 05/17/2021 06:05 AM    K 5.4 05/17/2021 06:05 AM    CL 101 05/17/2021 06:05 AM    CO2 24 05/17/2021 06:05 AM    BUN 20 05/17/2021 06:05 AM    CREATININE 0.9 05/17/2021 06:05 AM    GFRAA 105 11/15/2020 06:34 PM     LABGLOM 71 05/17/2021 06:05 AM    GLUCOSE 147 05/17/2021 06:05 AM    PROT 7.0 05/16/2021 06:03 AM    LABALBU 4.4 05/16/2021 06:03 AM    CALCIUM 9.6 05/17/2021 06:05 AM    BILITOT 0.50 05/16/2021 06:03 AM    ALKPHOS 79 05/16/2021 06:03 AM    AST 17 05/16/2021 06:03 AM    ALT 19 05/16/2021 06:03 AM       Imaging  CTA PULMONARY W CONTRAST    Result Date: 05/15/2021  CTA chest: 05/14/21 INDICATION: "R/O PE. Patient with underlying ILD, presents with SOB". COMPARISON: CT 02/16/2021 TECHNIQUE: PE protocol (Postcontrast imaging from the thoracic inlet through the  hemidiaphragms in the pulmonary arterial phase. Axial 5x5 mm soft tissue and lung 2x2 mm images. Multiplanar 3-D volumetric MIP reconstructions through the pulmonary arteries per protocol.) CT scanning  was performed using radiation dose  reduction techniques when appropriate, per system protocols. FINDINGS: Pulmonary arteries: No evidence of a pulmonary embolus. Lungs/Airways: There are basilar predominant subpleural reticular opacities bilaterally, similar to prior. Mild honeycombing in the bases. Mild traction bronchiectasis in the lower lobes. There is a background of confluent centrilobular emphysema and groundglass opacities. No new focal consolidation. There are a few sub-5 mm pulmonary nodules, similar to prior. Pleura: No pleural effusion or pneumothorax. Lymph Nodes: No mediastinal, hilar, or axillary adenopathy. Cardiovascular: Mild cardiomegaly. No pericardial effusion. Three-vessel coronary artery calcifications. Osseous structures: No suspicious lytic or blastic osseous lesions. Upper Abdomen: Small hiatal hernia.     No evidence of pulmonary embolus. 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 There is  a background of confluent centrilobular emphysema. No new focal consolidation.    Transthoracic echocardiogram (TTE) complete with contrast, bubble, strain, and 3D PRN    Result Date: 05/15/2021  ???  Left Ventricle: Normal  left ventricular systolic function with a visually estimated EF of 55 - 60%. Left ventricle size is normal. Normal wall thickness. Normal wall motion. Normal diastolic function. ???  Right Ventricle: Right ventricle is mildly dilated. Normal wall thickness. ???  Aortic Valve: Tricuspid valve. ???  Tricuspid Valve: Mild regurgitation with a centrally directed jet.        Medications   Inpatient Meds:  Scheduled  ??? ipratropium-albuterol  1 ampule Inhalation Q4H WA   ??? methylPREDNISolone  40 mg IntraVENous Q12H   ??? atorvastatin  10 mg Oral Daily   ??? levothyroxine  88 mcg Oral Daily   ??? losartan  50 mg Oral Daily   ??? montelukast  10 mg Oral Nightly   ??? sodium chloride flush  5-40 mL IntraVENous 2 times per day   ??? enoxaparin  40 mg SubCUTAneous Daily   ??? budesonide-formoterol  2 puff Inhalation BID   ??? omeprazole  20 mg Oral Daily   ??? nicotine  1 patch TransDERmal Daily   ??? insulin lispro  0-4 Units SubCUTAneous TID WC   ??? insulin lispro  0-4 Units SubCUTAneous Nightly     PRN  ALPRAZolam, 0.25 mg, Oral, Nightly PRN  albuterol, 15 mg/hr, Nebulization, Q6H PRN  sodium chloride flush, 5-40 mL, IntraVENous, PRN  sodium chloride, , IntraVENous, PRN  ondansetron, 4 mg, Oral, Q8H PRN   Or  ondansetron, 4 mg, IntraVENous, Q6H PRN  polyethylene glycol, 17 g, Oral, Daily PRN  acetaminophen, 650 mg, Oral, Q6H PRN   Or  acetaminophen, 650 mg, Rectal, Q6H PRN  ipratropium-albuterol, 1 ampule, Inhalation, Q4H PRN  glucose, 4 tablet, Oral, PRN  dextrose bolus, 125 mL, IntraVENous, PRN   Or  dextrose bolus, 250 mL, IntraVENous, PRN  glucagon (rDNA), 1 mg, SubCUTAneous, PRN  dextrose, , IntraVENous, Continuous PRN     Continuous  ??? sodium chloride     ??? dextrose         Home Meds:  Prior to Admission medications    Medication Sig Start Date End Date Taking? Authorizing Provider   magnesium oxide (MAG-OX) 400 MG tablet Take 400 mg by mouth daily   Yes Historical Provider, MD   Cholecalciferol (VITAMIN D) 50 MCG (2000 UT) CAPS capsule Take  2,000 Units by mouth daily   Yes Historical Provider, MD   omeprazole (PRILOSEC OTC) 20 MG tablet Take 20 mg by mouth daily   Yes Historical Provider, MD   albuterol sulfate HFA (PROVENTIL;VENTOLIN;PROAIR) 108 (90 Base) MCG/ACT inhaler 1 puff  as needed Inhalation every 4 hrs    Historical Provider, MD   atorvastatin (LIPITOR) 10 MG tablet Take 10 mg by mouth daily 04/06/21   Historical Provider, MD   fluticasone-salmeterol (ADVAIR) 250-50 MCG/ACT AEPB diskus inhaler Inhale into the lungs 2 times daily 05/06/21   Historical Provider, MD   hydrOXYzine HCl (ATARAX) 25 MG tablet Take 25 mg by mouth every 8 hours as needed 11/15/20   Historical Provider, MD   losartan (COZAAR) 50 MG tablet Take 50 mg by mouth daily 04/06/21   Historical Provider, MD   montelukast (SINGULAIR) 10 MG tablet 1 tablet Orally Once a day for 30 day(s)    Historical Provider, MD   tiotropium (SPIRIVA HANDIHALER) 18 MCG inhalation capsule 1 capsule by inhaling the contents of the capsule using the handihaler device Inhalation Once a day  Patient not taking: No sig reported    Historical Provider, MD   amoxicillin-clavulanate (AUGMENTIN) 875-125 MG per tablet Take 1 tablet by mouth in the morning and 1 tablet in the evening. Do all this for 10 days. 05/14/21 05/24/21  Clovia Cuff, APRN - NP   predniSONE (DELTASONE) 20 MG tablet Take 2 tablets by mouth daily for 5 days 05/14/21 05/19/21  Clovia Cuff, APRN - NP   azithromycin (ZITHROMAX) 250 MG tablet Take 1 tablet by mouth See Admin Instructions for 5 days 500mg  on day 1 followed by 250mg  on days 2 - 5 05/14/21 05/19/21  13/6/22, APRN - NP   levothyroxine (SYNTHROID) 88 MCG tablet 1 tablet daily by mouth 03/16/21   Clovia Cuff, MD   montelukast (SINGULAIR) 10 MG tablet Take 1 tablet by mouth nightly 03/16/21 06/14/21  05/16/21, MD         Assessment and Plan   Acute hypoxic respiratory failure  CPFE (combined pulmonary fibrosis/emphysema)  Chest x-ray corroborates findings noted on  prior CT scan from August.  She was on triple inhaler therapy with Advair and Spiriva (though states Dr Lydia Guiles took her off the Spiriva). She was influenza, COVID-negative. CT PE shows chronic lung disease as above, no new consolidations. TTE w/ normal EF. Continues to require 2lpm o2 and desaturated to 80s w/ ambulation. Continues on steroids. Discussed w/ pulmonology/Dr September, will look at previous outpatient testing. Anticipate she will be here until at least Friday, may need home o2.     Nicotine dependence  Continue nicotine patch, needs to quit smoking. States she bought her last pack last Wednesday and states she does not plan to start again.     HTN  Continue home losartan 50 mg daily. Will change to AM dosing per patient request as this how she takes it at home.     GERD  Continue home Prilosec.    History of hypothyroidism  TSH WNL. Will change timing of Synthroid to be given 1 hour prior to her PPI in the AM.     Situational anxiety  Add low dose PRN Xanax temporarily. Can change to hydroxyzine longer term.     VTE prophylaxis  SQ Lovenox    Disposition  Discharge plan: Home when medically stable. May end up needing home o2. Likely will be here through the end of the week.         Thursday, APRN - NP  05/17/2021 9:26 AM  [facility] Hospitalist Service

## 2021-05-17 NOTE — Progress Notes (Signed)
[facility]   [address]  [place] [address]  Phone: [phone]  Fax: [phone]  Acute Care Physical Therapy Treatment Note    (Link to Caseload Tracking PT Visit Days : 1  Acknowledge Orders   Time In/Out   PT Charge Capture   Rehab Caseload Tracker    Therapy Minute Tracking        [phone]      Melanie Sandoval is a 65 y.o. female   PRIMARY DIAGNOSIS Acute respiratory failure with hypoxia ([place])  COPD exacerbation ([place]) [J44.1]  Acute respiratory failure with hypoxia ([place]) [J96.01]  Acute respiratory failure with hypoxemia ([place]) [J96.01]       Inpatient    SUBJECTIVE   Melanie Sandoval states, "NO COMPLAINTS"     Social/Functional Lives With: Alone  Type of Home: House  Home Layout: One level  Home Access: Stairs to enter with rails  Entrance Stairs - Number of Steps: 4  Ambulation Assistance: Independent  Transfer Assistance: Independent    OBJECTIVE     VITAL SIGNS / PAIN / OXYGEN LINES / DRAINS / PRECAUTIONS   Vital Signs  SpO2:  (83% THEN QUICKLY BACK TO 90'S)     Oxygen  O2 Flow Rate (L/min): 2 L/min  SpO2:  (83% THEN QUICKLY BACK TO 90'S)     Pain - Pre Treatment      Pain - Post TreatmentNO PAIN       Precautions/Restrictions                      COGNITION COMMENTS   Cognition     Orientation       MOBILITY LEVEL 1 2 Complete  I Mod I Supervision Setup CGA Min A Mod A Max A Total A Does Not Occur   Roll Left '[]'  '[]'  '[]'  '[]'  '[]'  '[]'  '[]'  '[]'  '[]'  '[]'  '[]'  '[]'    Roll Right '[]'  '[]'  '[]'  '[]'  '[]'  '[]'  '[]'  '[]'  '[]'  '[]'  '[]'  '[]'    Supine to Sit '[]'  '[]'  '[]'  '[]'  '[]'  '[]'  '[]'  '[]'  '[]'  '[]'  '[]'  '[]'    Sit to Supine '[]'  '[]'  '[]'  '[]'  '[]'  '[]'  '[]'  '[]'  '[]'  '[]'  '[]'  '[]'    Scooting '[]'  '[]'  '[]'  '[]'  '[]'  '[]'  '[]'  '[]'  '[]'  '[]'  '[]'  '[]'    Edge of Bed Assist '[]'  '[]'  '[]'  '[]'  '[]'  '[]'  '[]'  '[]'  '[]'  '[]'  '[]'  '[]'    Transfer Sit to Stand '[]'  '[]'  '[]'  '[]'  '[x]'  '[]'  '[]'  '[]'  '[]'  '[]'  '[]'  '[]'    Transfer Stand to Sit '[]'  '[]'  '[]'  '[]'  '[]'  '[]'  '[]'  '[]'  '[]'  '[]'  '[]'  '[]'    Transfer Bed to/from Chair '[]'  '[]'  '[]'  '[]'  '[]'  '[]'  '[]'  '[]'  '[]'  '[]'  '[]'  '[]'    Transfer Toilet '[]'  '[]'  '[]'  '[]'  '[]'  '[]'  '[]'  '[]'  '[]'  '[]'  '[]'  '[]'     '[]'  '[]'  '[]'  '[]'  '[]'  '[]'  '[]'  '[]'  '[]'  '[]'  '[]'  '[]'       Mobility Comments        AMBULATION  1 2 Complete  I Mod I Supervision Setup CGA Min A Mod A Max A Total A Does Not Occur   Level of Assistance '[x]'  '[]'  '[]'  '[]'  '[x]'  '[]'  '[]'  '[]'  '[]'  '[]'  '[]'       Gait Training   600 FT   Gait Quality    DME None   Stairs        Ambulation Comments        BALANCE Good Fair+ Fair Fair- Poor  NT Comments   Sitting Static '[]'  '[]'  '[]'  '[]'  '[]'  '[]'     Sitting Dynamic '[]'  '[]'  '[]'  '[]'  '[]'  '[]'               Standing Static '[]'  '[]'  '[]'  '[]'  '[]'  '[]'     Standing Dynamic '[]'  '[]'  '[]'  '[]'  '[]'  '[]'       ASSESSMENT      POST ACUTE RECOMMENDATIONS   Recommendation to date pending progress   Setting Home Health Therapy    Justification for Recommended Setting Recommended to help patient obtain maximum functional independence.     Equipment None     ASSESSMENT:  Melanie Sandoval SIT TO STAND CGA/ SBA. GAIT TRAINING WITH NO DEVICE X 600 FT WITH 3 STANDING REST BREAKS, SBA. 02 SATS ON 2 LITERS DURING AMBULATION DECREASED TO 83% AT ONE TIME THEN QUICKLY INCREASED BACK TO 90'S WITH STANDING REST BREAK. OTHERWISE, 02 SATS DECREASED TO 86% X 2 AND QUICKLY INCREASED BACK TO 90'S WITH STANDING REST BREAK, CUES FOR DEEP BREATHING. RN , WHO IS AN RN, NO LOB.     Melanie Sandoval??? ???6 Clicks??? Basic Mobility Inpatient Short Form        FREQUENCY AND DURATION   for duration of hospital stay or until stated goals are met, whichever comes first.    THERAPY PROGNOSIS Excellent    PROBLEM LIST  (Skilled intervention is medically necessary to address:)  Decreased Activity Tolerance INTERVENTIONS PLANNED  (Benefits and precautions of physical therapy have been discussed with the patient.)  Gait Training     Goals              TREATMENT   EVALUATION     TREATMENT GRID     TREATMENT COMMENTS    SAFETY Bed/Chair locked and DAUGHTER, WHO IS AN RN WITH [employer], PRESENT AND VERY ATTENTIVE    Education

## 2021-05-17 NOTE — Consults (Signed)
Consult Note            Date:05/17/2021        Patient Name:Melanie Sandoval     Date of Birth:1955/12/01     Age:65 y.o.    Reason for Consult: Exacerbation of interstitial lung disease with hypoxia    Chief Complaint     Chief Complaint   Patient presents with    Shortness of Breath     Was seen at urgent care in [place].  Got IM rocephin, steroids, covid and flu were negative.  They recommended admit however pt refused,           History Obtained From   patient    History of Present Illness   65 year old patient of Dr. 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  She was seen in the clinic in the past with pulmonary function testing showing DLCO approximately 40% of predicted with relatively well-preserved spirometric values. In May, she was requested to have serologies performed as part of an evaluation for interstitial lung disease.  She was most recently seen in the office in August and had not yet completed those labs.  Lab results are still not evident.  She began feeling poorly with some increased shortness of breath this last weekend.  She had documented hypoxia and was admitted to the hospital where she has been receiving antibiotics and steroids along with oxygen.  She has never used home oxygen in the past.  She has minimal white phlegm production.  I have independently reviewed her chest CT scanning from this hospitalization and compared to the most recent study available finding notes significant difference between the 2.    Past Medical History     Past Medical History:   Diagnosis Date    Asthma     COPD (chronic obstructive pulmonary disease) (HCC)     Hyperlipidemia     Hypertension     Hypothyroidism    Interstitial lung disease  History of alcohol cirrhosis    Past Surgical History     Past Surgical History:   Procedure Laterality Date    ELBOW SURGERY Left     KNEE ARTHROSCOPY Right     THYROID  SURGERY          Medications     Prior to Admission medications    Medication Sig Start Date End Date Taking? Authorizing Provider   magnesium oxide (MAG-OX) 400 MG tablet Take 400 mg by mouth daily   Yes Historical Provider, MD   Cholecalciferol (VITAMIN D) 50 MCG (2000 UT) CAPS capsule Take 2,000 Units by mouth daily   Yes Historical Provider, MD   omeprazole (PRILOSEC OTC) 20 MG tablet Take 20 mg by mouth daily   Yes Historical Provider, MD   albuterol sulfate HFA (PROVENTIL;VENTOLIN;PROAIR) 108 (90 Base) MCG/ACT inhaler 1 puff as needed Inhalation every 4 hrs    Historical Provider, MD   atorvastatin (LIPITOR) 10 MG tablet Take 10 mg by mouth daily 04/06/21   Historical Provider, MD   fluticasone-salmeterol (ADVAIR) 250-50 MCG/ACT AEPB diskus inhaler Inhale into the lungs 2 times daily 05/06/21   Historical Provider, MD   hydrOXYzine HCl (ATARAX) 25 MG tablet Take 25 mg by mouth every 8 hours as needed 11/15/20   Historical Provider, MD   losartan (COZAAR) 50 MG tablet Take 50 mg by mouth daily 04/06/21   Historical Provider, MD   montelukast (SINGULAIR) 10  MG tablet 1 tablet Orally Once a day for 30 day(s)    Historical Provider, MD   tiotropium (SPIRIVA HANDIHALER) 18 MCG inhalation capsule 1 capsule by inhaling the contents of the capsule using the handihaler device Inhalation Once a day  Patient not taking: No sig reported    Historical Provider, MD   amoxicillin-clavulanate (AUGMENTIN) 875-125 MG per tablet Take 1 tablet by mouth in the morning and 1 tablet in the evening. Do all this for 10 days. 05/14/21 05/24/21  Clovia Cuff, APRN - NP   predniSONE (DELTASONE) 20 MG tablet Take 2 tablets by mouth daily for 5 days 05/14/21 05/19/21  Clovia Cuff, APRN - NP   azithromycin (ZITHROMAX) 250 MG tablet Take 1 tablet by mouth See Admin Instructions for 5 days 500mg  on day 1 followed by 250mg  on days 2 - 5 05/14/21 05/19/21  13/6/22, APRN - NP   levothyroxine (SYNTHROID) 88 MCG tablet 1 tablet daily by  mouth 03/16/21   Clovia Cuff, MD   montelukast (SINGULAIR) 10 MG tablet Take 1 tablet by mouth nightly 03/16/21 06/14/21  05/16/21, MD        ALPRAZolam 14/7/22) tablet 0.25 mg, Nightly PRN  [START ON 05/18/2021] levothyroxine (SYNTHROID) tablet 88 mcg, Daily  losartan (COZAAR) tablet 50 mg, Daily  ipratropium-albuterol (DUONEB) nebulizer solution 1 ampule, Q4H WA  methylPREDNISolone sodium (SOLU-MEDROL) injection 40 mg, Q12H  albuterol (PROVENTIL) nebulizer solution, Q6H PRN  atorvastatin (LIPITOR) tablet 10 mg, Daily  montelukast (SINGULAIR) tablet 10 mg, Nightly  sodium chloride flush 0.9 % injection 5-40 mL, 2 times per day  sodium chloride flush 0.9 % injection 5-40 mL, PRN  0.9 % sodium chloride infusion, PRN  ondansetron (ZOFRAN-ODT) disintegrating tablet 4 mg, Q8H PRN   Or  ondansetron (ZOFRAN) injection 4 mg, Q6H PRN  polyethylene glycol (GLYCOLAX) packet 17 g, Daily PRN  acetaminophen (TYLENOL) tablet 650 mg, Q6H PRN   Or  acetaminophen (TYLENOL) suppository 650 mg, Q6H PRN  ipratropium-albuterol (DUONEB) nebulizer solution 1 ampule, Q4H PRN  enoxaparin (LOVENOX) injection 40 mg, Daily  budesonide-formoterol (SYMBICORT) 160-4.5 MCG/ACT inhaler 2 puff, BID  omeprazole (PRILOSEC) delayed release capsule 20 mg, Daily  nicotine (NICODERM CQ) 14 MG/24HR 1 patch, Daily  insulin lispro (HUMALOG) injection vial 0-4 Units, TID WC  insulin lispro (HUMALOG) injection vial 0-4 Units, Nightly  glucose chewable tablet 16 g, PRN  dextrose bolus 10% 125 mL, PRN   Or  dextrose bolus 10% 250 mL, PRN  glucagon (rDNA) injection 1 mg, PRN  dextrose 10 % infusion, Continuous PRN        Allergies   Patient has no known allergies.    Social History     Social History       Tobacco History       Smoking Status  Every Day Smoking Frequency  0.25 packs/day Smoking Tobacco Type  Cigarettes      Smokeless Tobacco Use  Never              Alcohol History       Alcohol Use Status  Not Asked              Drug Use       Drug Use  Status  Not Asked              Sexual Activity       Sexually Active  Not Asked  Family History     Family History   Problem Relation Age of Onset    Heart Attack Father        Review of Systems   Negative except shortness of breath, cough, anxiety    Physical Exam   BP (!) 157/84    Pulse 71    Temp 98 ??F (36.7 ??C) (Oral)    Resp 18    Ht 5\' 2"  (1.575 m)    Wt 141 lb 15.6 oz (64.4 kg)    SpO2 90%    BMI 25.97 kg/m??     Age-appropriate  Normocephalic  Neck-supple  Lungs-with crackles and no wheezes  CV-regular  Abdomen-benign  Extremities with clubbing  Neuro-nonfocal  Mental status intact    Labs    CBC:  Recent Labs     05/14/21  1532 05/15/21  0641 05/17/21  0605   WBC 4.7 7.5 7.1   RBC 4.49 4.50 4.51   HGB 14.3 14.3 14.5   HCT 41.3 41.6 43.3   MCV 92.0 92.4 96.0   RDW 12.3 12.2 12.3   PLT 133* 153 160     CHEMISTRIES:  Recent Labs     05/14/21  1532 05/16/21  0603 05/17/21  0605   NA 135 137 135   K 4.3 4.7 5.4*   CL 101 100 101   CO2 21* 24 24   BUN 13 20 20    CREATININE 0.7 0.9 0.9   GLUCOSE 170* 144* 147*   PHOS  --  4.9*  --    MG 1.1*  --  1.7     PT/INR:No results for input(s): PROTIME, INR in the last 72 hours.  APTT:No results for input(s): APTT in the last 72 hours.  LIVER PROFILE:  Recent Labs     05/14/21  1532 05/16/21  0603   AST 30 17   ALT 20 19   BILITOT 0.46 0.50   ALKPHOS 87 79       Imaging/Diagnostics   XR CHEST PORTABLE    Result Date: 05/14/2021  Findings are consistent with IPF similar to prior CT. No acute airspace consolidation identified.    CTA PULMONARY W CONTRAST    Result Date: 05/15/2021  No evidence of pulmonary embolus. 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 There is  a background of confluent centrilobular emphysema. No new focal consolidation.      Assessment      Hospital Problems             Last Modified POA    * (Principal) Acute respiratory failure with hypoxia (HCC) 05/14/2021 Yes    Interstitial lung disease (HCC) 05/14/2021  Yes    Cigarette nicotine dependence without complication 05/14/2021 Yes    COPD with acute exacerbation (HCC) 05/14/2021 Yes    Hypomagnesemia 05/14/2021 Yes    Acute respiratory failure with hypoxemia (HCC) 05/14/2021 Yes       Plan   1.  Exacerbation of interstitial lung disease-agree with current treatment including azithromycin alone for possible atypical infection, steroids at current dose, oxygen therapy as needed, supportive care.  I have encouraged the patient to increase activity as possible and discussed oxygen supplementation as a potential discharge therapy as well.  I will order the serologies that she has failed to complete in outpatient setting.  We will follow along during her hospital review.    Electronically signed by 13/12/2020, MD on 05/17/21 at 9:42 AM EST

## 2021-05-18 LAB — CBC
Hematocrit: 45.7 % (ref 34.0–47.0)
Hemoglobin: 15.4 g/dL (ref 11.5–15.7)
MCH: 31.9 pg (ref 27.0–34.5)
MCHC: 33.7 g/dL (ref 32.0–36.0)
MCV: 94.6 fL (ref 81.0–99.0)
MPV: 9.5 fL (ref 7.2–13.2)
Platelets: 181 10*3/uL (ref 140–440)
RBC: 4.83 x10e6/mcL (ref 3.60–5.20)
RDW: 12.2 % (ref 11.0–16.0)
WBC: 9.2 10*3/uL (ref 3.8–10.6)

## 2021-05-18 LAB — POCT GLUCOSE
POC Glucose: 128 mg/dL — ABNORMAL HIGH (ref 65.0–110.0)
POC Glucose: 102 mg/dL (ref 65.0–110.0)
POC Glucose: 113 mg/dL — ABNORMAL HIGH (ref 65.0–110.0)
POC Glucose: 159 mg/dL — ABNORMAL HIGH (ref 65.0–110.0)

## 2021-05-18 LAB — BASIC METABOLIC PANEL W/ REFLEX TO MG FOR LOW K
Anion Gap: 12 mmol/L (ref 2–17)
BUN: 24 mg/dL — ABNORMAL HIGH (ref 8–23)
CO2: 23 mmol/L (ref 22–29)
Calcium: 10 mg/dL (ref 8.8–10.2)
Chloride: 101 mmol/L (ref 98–107)
Creatinine: 0.9 mg/dL (ref 0.5–1.0)
Est, Glom Filt Rate: 71 mL/min/1.73m?? — ABNORMAL LOW (ref >=90)
Glucose: 126 mg/dL — ABNORMAL HIGH (ref 70–99)
OSMOLALITY CALCULATED: 277 mOsm/kg (ref 270–287)
Potassium: 4.8 mmol/L (ref 3.5–5.3)
Sodium: 136 mmol/L (ref 135–145)

## 2021-05-18 LAB — ANTI-SCLERODERMA ANTIBODY: Scleroderma SCL-70: <0.2 AI (ref 0.0–0.9)

## 2021-05-18 LAB — SJOGREN'S ANTIBODIES (SS-A, SS-B)
Sjogren's Antibodies (SSA): <0.2 AI (ref 0.0–0.9)
Sjogren's Antibodies (SSB): <0.2 AI (ref 0.0–0.9)

## 2021-05-18 LAB — ANA: ANA by IFA: NEGATIVE

## 2021-05-18 MED ORDER — AMLODIPINE BESYLATE 5 MG PO TABS
5 MG | Freq: Every day | ORAL | Status: DC
Start: 2021-05-18 — End: 2021-05-18

## 2021-05-18 MED ORDER — BENZONATATE 100 MG PO CAPS
100 MG | Freq: Three times a day (TID) | ORAL | Status: AC | PRN
Start: 2021-05-18 — End: 2021-05-19
  Administered 2021-05-18 – 2021-05-19 (×2): 100 mg via ORAL

## 2021-05-18 MED ORDER — HYDRALAZINE HCL 20 MG/ML IJ SOLN
20 MG/ML | Freq: Four times a day (QID) | INTRAMUSCULAR | Status: AC | PRN
Start: 2021-05-18 — End: 2021-05-19

## 2021-05-18 MED ORDER — GUAIFENESIN ER 600 MG PO TB12
600 MG | Freq: Two times a day (BID) | ORAL | Status: DC
Start: 2021-05-18 — End: 2021-05-19
  Administered 2021-05-18 – 2021-05-19 (×3): 600 mg via ORAL

## 2021-05-18 MED ORDER — AMLODIPINE BESYLATE 2.5 MG PO TABS
2.5 MG | Freq: Every day | ORAL | Status: AC
Start: 2021-05-18 — End: 2021-05-19
  Administered 2021-05-18 – 2021-05-19 (×2): 2.5 mg via ORAL

## 2021-05-18 MED FILL — TYLENOL 325 MG PO TABS: 325 MG | ORAL | Qty: 2

## 2021-05-18 MED FILL — MONTELUKAST SODIUM 10 MG PO TABS: 10 MG | ORAL | Qty: 1

## 2021-05-18 MED FILL — LEVOTHYROXINE SODIUM 88 MCG PO TABS: 88 MCG | ORAL | Qty: 1

## 2021-05-18 MED FILL — AMLODIPINE BESYLATE 2.5 MG PO TABS: 2.5 MG | ORAL | Qty: 1

## 2021-05-18 MED FILL — IPRATROPIUM-ALBUTEROL 0.5-2.5 (3) MG/3ML IN SOLN: RESPIRATORY_TRACT | Qty: 3

## 2021-05-18 MED FILL — NICOTINE 14 MG/24HR TD PT24: 14 MG/24HR | TRANSDERMAL | Qty: 1

## 2021-05-18 MED FILL — SOLU-MEDROL 40 MG IJ SOLR: 40 MG | INTRAMUSCULAR | Qty: 40

## 2021-05-18 MED FILL — OMEPRAZOLE 20 MG PO CPDR: 20 MG | ORAL | Qty: 1

## 2021-05-18 MED FILL — LOSARTAN POTASSIUM 50 MG PO TABS: 50 MG | ORAL | Qty: 1

## 2021-05-18 MED FILL — BENZONATATE 100 MG PO CAPS: 100 MG | ORAL | Qty: 1

## 2021-05-18 MED FILL — ATORVASTATIN CALCIUM 10 MG PO TABS: 10 MG | ORAL | Qty: 1

## 2021-05-18 MED FILL — MUCINEX 600 MG PO TB12: 600 MG | ORAL | Qty: 1

## 2021-05-18 MED FILL — ENOXAPARIN SODIUM 40 MG/0.4ML IJ SOSY: 40 MG/0.4ML | INTRAMUSCULAR | Qty: 0.4

## 2021-05-18 MED FILL — NORMAL SALINE FLUSH 0.9 % IV SOLN: 0.9 % | INTRAVENOUS | Qty: 10

## 2021-05-18 NOTE — Progress Notes (Signed)
Melanie Sandoval is a 65 y.o. female patient of Dr. 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    Current Facility-Administered Medications   Medication Dose Route Frequency Provider Last Rate Last Admin    hydrALAZINE (APRESOLINE) injection 10 mg  10 mg IntraVENous Q6H PRN Luna Fuse, MD        benzonatate (TESSALON) capsule 100 mg  100 mg Oral TID PRN Luna Fuse, MD        amLODIPine (NORVASC) tablet 2.5 mg  2.5 mg Oral Daily Luna Fuse, MD   2.5 mg at 05/18/21 0951    ALPRAZolam (XANAX) tablet 0.25 mg  0.25 mg Oral Nightly PRN Truett Mainland, APRN - NP        levothyroxine (SYNTHROID) tablet 88 mcg  88 mcg Oral Daily Truett Mainland, APRN - NP   88 mcg at 05/18/21 0601    losartan (COZAAR) tablet 50 mg  50 mg Oral Daily Truett Mainland, APRN - NP   50 mg at 05/18/21 0924    ipratropium-albuterol (DUONEB) nebulizer solution 1 ampule  1 ampule Inhalation Q4H WA Johnella Moloney, MD   1 ampule at 05/18/21 0826    methylPREDNISolone sodium (SOLU-MEDROL) injection 40 mg  40 mg IntraVENous Q12H Johnella Moloney, MD   40 mg at 05/18/21 0924    albuterol (PROVENTIL) nebulizer solution  15 mg/hr Nebulization Q6H PRN Rebeca Alert, MD        atorvastatin (LIPITOR) tablet 10 mg  10 mg Oral Daily Rebeca Alert, MD   10 mg at 05/17/21 2136    montelukast (SINGULAIR) tablet 10 mg  10 mg Oral Nightly Rebeca Alert, MD   10 mg at 05/17/21 2135    sodium chloride flush 0.9 % injection 5-40 mL  5-40 mL IntraVENous 2 times per day Rebeca Alert, MD   10 mL at 05/18/21 0932    sodium chloride flush 0.9 % injection 5-40 mL  5-40 mL IntraVENous PRN Rebeca Alert, MD        0.9 % sodium chloride infusion   IntraVENous PRN Rebeca Alert, MD        ondansetron (ZOFRAN-ODT) disintegrating tablet 4 mg  4 mg Oral Q8H PRN Rebeca Alert, MD        Or    ondansetron [facility]) injection 4 mg  4 mg IntraVENous Q6H PRN Rebeca Alert, MD        polyethylene glycol (GLYCOLAX) packet 17 g  17 g  Oral Daily PRN Rebeca Alert, MD        acetaminophen (TYLENOL) tablet 650 mg  650 mg Oral Q6H PRN Rebeca Alert, MD   650 mg at 05/18/21 7741    Or    acetaminophen (TYLENOL) suppository 650 mg  650 mg Rectal Q6H PRN Rebeca Alert, MD        ipratropium-albuterol (DUONEB) nebulizer solution 1 ampule  1 ampule Inhalation Q4H PRN Rebeca Alert, MD   1 ampule at 05/16/21 0752    enoxaparin (LOVENOX) injection 40 mg  40 mg SubCUTAneous Daily Rebeca Alert, MD   40 mg at 05/17/21 2136    budesonide-formoterol (SYMBICORT) 160-4.5 MCG/ACT inhaler 2 puff  2 puff Inhalation BID Rebeca Alert, MD   2 puff at 05/18/21 0827    omeprazole (PRILOSEC) delayed release capsule 20 mg  20 mg Oral Daily Rebeca Alert, MD   20 mg at 05/18/21 [phone]  nicotine (NICODERM CQ) 14 MG/24HR 1 patch  1 patch TransDERmal Daily Rebeca Alert, MD   1 patch at 05/18/21 0925    insulin lispro (HUMALOG) injection vial 0-4 Units  0-4 Units SubCUTAneous TID WC Nihar Monia Pouch, MD        insulin lispro (HUMALOG) injection vial 0-4 Units  0-4 Units SubCUTAneous Nightly Rebeca Alert, MD        glucose chewable tablet 16 g  4 tablet Oral PRN Rebeca Alert, MD        dextrose bolus 10% 125 mL  125 mL IntraVENous PRN Rebeca Alert, MD        Or    dextrose bolus 10% 250 mL  250 mL IntraVENous PRN Rebeca Alert, MD        glucagon (rDNA) injection 1 mg  1 mg SubCUTAneous PRN Rebeca Alert, MD        dextrose 10 % infusion   IntraVENous Continuous PRN Rebeca Alert, MD         No Known Allergies  Principal Problem:    Acute respiratory failure with hypoxia (HCC)  Active Problems:    Interstitial lung disease (HCC)    Cigarette nicotine dependence without complication    COPD with acute exacerbation (HCC)    Hypomagnesemia    Acute respiratory failure with hypoxemia (HCC)  Resolved Problems:    * No resolved hospital problems. *    Blood pressure (!) 153/95, pulse 68, temperature 98.2 ??F (36.8 ??C), temperature source  Oral, resp. rate 18, height 5\' 2"  (1.575 m), weight 142 lb 13.7 oz (64.8 kg), SpO2 92 %.    Subjective  Patient may feel marginally better.  Had a time this morning that she did not feel breathing was well.  She did walk some around halls with breaks.    Objective:  Vital signs: (most recent): Blood pressure (!) 153/95, pulse 68, temperature 98.2 ??F (36.8 ??C), temperature source Oral, resp. rate 18, height 5\' 2"  (1.575 m), weight 142 lb 13.7 oz (64.8 kg), SpO2 92 %.    CV reg  Lungs with rales and wheezes  Abd soft  Ext+pulses and clubbing    Assessment & Plan  Exacerbation of interstitial lung disease- continue with current treatment including azithromycin alone for possible atypical infection, steroids at current dose, oxygen therapy as needed, supportive care.  I have encouraged the patient to increase activity as possible and discussed oxygen supplementation as a potential discharge therapy as well.  At this point I would anticipate her earliest discharge date to be Saturday if she progressively improves.  We will follow along during her hospital review.       , MD  05/18/2021

## 2021-05-18 NOTE — Progress Notes (Signed)
Progress Note    Date:05/18/2021       Room:3116/01  Patient Name:Melanie Sandoval     Date of Birth:Jun 19, 1956     Age:65 y.o.    Subjective   Interval History Status:   Patient is reporting more cough since yesterday. She is requiring ~2L O2 currently.       Medications   Scheduled Meds:    amLODIPine  5 mg Oral Daily    levothyroxine  88 mcg Oral Daily    losartan  50 mg Oral Daily    ipratropium-albuterol  1 ampule Inhalation Q4H WA    methylPREDNISolone  40 mg IntraVENous Q12H    atorvastatin  10 mg Oral Daily    montelukast  10 mg Oral Nightly    sodium chloride flush  5-40 mL IntraVENous 2 times per day    enoxaparin  40 mg SubCUTAneous Daily    budesonide-formoterol  2 puff Inhalation BID    omeprazole  20 mg Oral Daily    nicotine  1 patch TransDERmal Daily    insulin lispro  0-4 Units SubCUTAneous TID WC    insulin lispro  0-4 Units SubCUTAneous Nightly     Continuous Infusions:    sodium chloride      dextrose       PRN Meds: hydrALAZINE, benzonatate, ALPRAZolam, albuterol, sodium chloride flush, sodium chloride, ondansetron **OR** ondansetron, polyethylene glycol, acetaminophen **OR** acetaminophen, ipratropium-albuterol, glucose, dextrose bolus **OR** dextrose bolus, glucagon (rDNA), dextrose      Physical Examination      Vitals:  BP (!) 153/95    Pulse 68    Temp 98.2 ??F (36.8 ??C) (Oral)    Resp 18    Ht 5\' 2"  (1.575 m)    Wt 142 lb 13.7 oz (64.8 kg)    SpO2 92%    BMI 26.13 kg/m??   Temp (24hrs), Avg:97.8 ??F (36.6 ??C), Min:97.5 ??F (36.4 ??C), Max:98.2 ??F (36.8 ??C)      I/O (24Hr):    Intake/Output Summary (Last 24 hours) at 05/18/2021 0904  Last data filed at 05/18/2021 0501  Gross per 24 hour   Intake 800 ml   Output 1500 ml   Net -700 ml       General: Alert and oriented, no acute distress.  Neck: Supple  Lungs: Clear to auscultation, non-labored respiration.  Heart: Normal rate, regular rhythm, no murmur, gallop or edema.  Abdomen: Soft, non-tender, non-distended, normal bowel sounds, no  masses.  Musculoskeletal: Normal range of motion and strength, no tenderness or swelling.  Skin: Skin is warm, dry , no rashes or lesions.  Neurologic: Awake, alert, and oriented X3, CN II-XII intact.  Psychiatric: Cooperative, appropriate mood and affect.    Labs/Imaging/Diagnostics   Labs:  CBC:  Recent Labs     05/17/21  0605 05/18/21  0213   WBC 7.1 9.2   RBC 4.51 4.83   HGB 14.5 15.4   HCT 43.3 45.7   MCV 96.0 94.6   RDW 12.3 12.2   PLT 160 181     CHEMISTRIES:  Recent Labs     05/16/21  0603 05/17/21  0605 05/18/21  0213   NA 137 135 136   K 4.7 5.4* 4.8   CL 100 101 101   CO2 24 24 23    BUN 20 20 24*   CREATININE 0.9 0.9 0.9   GLUCOSE 144* 147* 126*   PHOS 4.9*  --   --    MG  --  1.7  --      PT/INR:No results for input(s): PROTIME, INR in the last 72 hours.  APTT:No results for input(s): APTT in the last 72 hours.  LIVER PROFILE:  Recent Labs     05/16/21  0603   AST 17   ALT 19   BILITOT 0.50   ALKPHOS 79       Imaging Last 24 Hours:  No results found.      Assessment and Plan        Hospital Problems             Last Modified POA    * (Principal) Acute respiratory failure with hypoxia (HCC) 05/14/2021 Yes    Interstitial lung disease (HCC) 05/14/2021 Yes    Cigarette nicotine dependence without complication 05/14/2021 Yes    COPD with acute exacerbation (HCC) 05/14/2021 Yes    Hypomagnesemia 05/14/2021 Yes    Acute respiratory failure with hypoxemia (HCC) 05/14/2021 Yes     Acute hypoxic respiratory failure  Combination of COPD, Pulmonary fibrosis, bronchiectasis. Continue azithromycin. Continue bronchodilator treatment and IV steroids.   Pulmonary following       Nicotine dependence    HTN  Continue home losartan 50 mg daily  Added low dose amlodipine    GERD  Continue home Prilosec     History of hypothyroidism  TSH WNL        VTE prophylaxis  SQ Lovenox     Disposition  Discharge plan:   Can likely DC in am with home O2. Daughter also asked for nebulizer.     Plan of care discussed with the patient and  daughter at the bedside.         Electronically signed by Luna Fuse, MD on 05/18/21 at 9:04 AM EST

## 2021-05-18 NOTE — Progress Notes (Signed)
[facility]  [place] [address]  Phone: [phone]  Fax: [phone]  Acute Care Physical Therapy Treatment Note    (Link to Caseload Tracking PT Visit Days : 1  Acknowledge Orders   Time In/Out   PT Charge Capture   Rehab Caseload Tracker    Therapy Minute Tracking        1304  1319  Melanie Sandoval is a 65 y.o. female   PRIMARY DIAGNOSIS Acute respiratory failure with hypoxia ([place])  COPD exacerbation ([place]) [J44.1]  Acute respiratory failure with hypoxia ([place]) [J96.01]  Acute respiratory failure with hypoxemia ([place]) [J96.01]       Inpatient    SUBJECTIVE   Melanie Sandoval states, "I am feeling pretty good"     Social/Functional Lives With: Alone  Type of Home: House  Home Layout: One level  Home Access: Stairs to enter with rails  Entrance Stairs - Number of Steps: 4  Ambulation Assistance: Independent  Transfer Assistance: Independent    OBJECTIVE     VITAL SIGNS / PAIN / OXYGEN LINES / DRAINS / PRECAUTIONS   Vital Signs    02 remained above 87% on 2L    Oxygen        Pain - Pre Treatment Pain Assessment: None - Denies Pain    Pain - Post Treatment   Oxygen 2L    Precautions/Restrictions                    GROSS EVALUATION COMMENTS   AROM     PROM     Strength     Balance     Posture     Sensation     Coordination      Tone       COGNITION / PERCEPTION COMMENTS   Orientation     Cognition Overall Cognitive Status: WNL   Vision     Hearing       MOBILITY LEVEL 1 2 Complete  I Mod I Supervision Setup CGA Min A Mod A Max A Total A Does Not Occur Comments     Roll Left '[]'  '[]'  '[]'  '[]'  '[]'  '[]'  '[]'  '[]'  '[]'  '[]'  '[]'  '[]'       Roll Right '[]'  '[]'  '[]'  '[]'  '[]'  '[]'  '[]'  '[]'  '[]'  '[]'  '[]'  '[]'       Supine to Sit '[]'  '[]'  '[]'  '[]'  '[]'  '[]'  '[]'  '[]'  '[]'  '[]'  '[]'  '[]'       Sit to Supine '[]'  '[]'  '[]'  '[]'  '[]'  '[]'  '[]'  '[]'  '[]'  '[]'  '[]'  '[]'       Scooting '[]'  '[]'  '[]'  '[]'  '[x]'  '[]'  '[]'  '[]'  '[]'  '[]'  '[]'  '[]'       Edge of Bed Assist '[]'  '[]'  '[]'  '[]'  '[]'  '[]'  '[]'  '[]'  '[]'  '[]'  '[]'  '[]'     Transfer Sit to Stand '[]'  '[]'  '[]'  '[]'  '[x]'  '[]'  '[]'  '[]'  '[]'  '[]'  '[]'  '[]'     Transfer Stand to Sit '[]'  '[]'  '[]'   '[]'  '[x]'  '[]'  '[]'  '[]'  '[]'  '[]'  '[]'  '[]'     Transfer Bed to/from Chair '[]'  '[]'  '[]'  '[]'  '[]'  '[]'  '[]'  '[]'  '[]'  '[]'  '[]'  '[]'     Transfer Toilet '[]'  '[]'  '[]'  '[]'  '[]'  '[]'  '[]'  '[]'  '[]'  '[]'  '[]'  '[]'         AMBULATION LEVEL 1 2 Complete  I Mod I Supervision Setup CGA Min A Mod A Max A Total A Does Not Occur Comments   Level of Assistance '[]'  '[]'  '[]'  '[]'  [  x] '[]'  '[]'  '[]'  '[]'  '[]'  '[]'        AMBULATION  Distance  600 feet   DME None   Gait Quality N/A   Stairs        Ambulation Comments        BALANCE Good Fair+ Fair Fair- Poor NT Comments   Sitting Static '[x]'  '[]'  '[]'  '[]'  '[]'  '[]'     Sitting Dynamic '[x]'  '[]'  '[]'  '[]'  '[]'  '[]'               Standing Static '[x]'  '[]'  '[]'  '[]'  '[]'  '[]'     Standing Dynamic '[x]'  '[]'  '[]'  '[]'  '[]'  '[]'       ASSESSMENT      POST ACUTE RECOMMENDATIONS   Recommendation to date pending progress   Setting No further skilled therapy after discharge from hospital    Justification for Recommended Setting       Equipment  home oxygen     ASSESSMENT:  Melanie Sandoval ambulated 600 ft with SBA and 02 monitored throughout. Sats stayed at 87% or above throughout and pt took three standing rest breaks but reported no SOB. Pt may need home 02 but no longer has any skilled PT needs. Discharge from PT services.      Goals      Time Frame for Short Term Goals: 3 days  Short Term Goal 1: Pt will perform all transfers independently  Short Term Goal 2: Pt will ambulate 100 ft with supervision and 02 >90%  Time Frame for Long Term Goals : 10 days  Long Term Goal 1: Pt will ambulate 200 ft with 02>90% and independently  Long Term Goal 2: Pt will perform 4 steps with rail use and 02 >90% independently  Pt has met all goals except 02 levels staying above 90%.  SAFETY Visitors at bedside    Education Education Given To: Patient, Family  Education Provided: Role of Therapy, Plan of Care  Education Method: Verbal  Barriers to Learning: None  Education Outcome: Verbalized understanding    Humberto Seals, PT

## 2021-05-19 ENCOUNTER — Encounter: Payer: MEDICARE | Attending: Surgery | Primary: Family Medicine

## 2021-05-19 LAB — CBC WITH AUTO DIFFERENTIAL
Absolute Baso #: 0 10*3/uL (ref 0.0–0.2)
Absolute Eos #: 0 10*3/uL (ref 0.0–0.5)
Absolute Lymph #: 1 10*3/uL (ref 1.0–3.2)
Absolute Mono #: 0.4 10*3/uL (ref 0.3–1.0)
Basophils %: 0 % (ref 0.0–2.0)
Eosinophils %: 0.2 % (ref 0.0–7.0)
Hematocrit: 42.2 % (ref 34.0–47.0)
Hemoglobin: 14.2 g/dL (ref 11.5–15.7)
Immature Grans (Abs): 0.02 10*3/uL (ref 0.00–0.06)
Immature Granulocytes: 0.3 % (ref 0.0–0.6)
Lymphocytes: 16.3 % (ref 15.0–45.0)
MCH: 31.8 pg (ref 27.0–34.5)
MCHC: 33.6 g/dL (ref 32.0–36.0)
MCV: 94.4 fL (ref 81.0–99.0)
MPV: 9.6 fL (ref 7.2–13.2)
Monocytes: 5.7 % (ref 4.0–12.0)
Neutrophils %: 77.5 % — ABNORMAL HIGH (ref 42.0–74.0)
Neutrophils Absolute: 4.9 10*3/uL (ref 1.6–7.3)
Platelets: 159 10*3/uL (ref 140–440)
RBC: 4.47 x10e6/mcL (ref 3.60–5.20)
RDW: 12 % (ref 11.0–16.0)
WBC: 6.4 10*3/uL (ref 3.8–10.6)

## 2021-05-19 LAB — BASIC METABOLIC PANEL W/ REFLEX TO MG FOR LOW K
Anion Gap: 15 mmol/L (ref 2–17)
BUN: 27 mg/dL — ABNORMAL HIGH (ref 8–23)
CO2: 20 mmol/L — ABNORMAL LOW (ref 22–29)
Calcium: 9.6 mg/dL (ref 8.8–10.2)
Chloride: 99 mmol/L (ref 98–107)
Creatinine: 0.8 mg/dL (ref 0.5–1.0)
Est, Glom Filt Rate: 82 mL/min/1.73m?? — ABNORMAL LOW (ref >=90)
Glucose: 125 mg/dL — ABNORMAL HIGH (ref 70–99)
OSMOLALITY CALCULATED: 275 mOsm/kg (ref 270–287)
Potassium: 4.4 mmol/L (ref 3.5–5.3)
Sodium: 134 mmol/L — ABNORMAL LOW (ref 135–145)

## 2021-05-19 LAB — ANCA PANEL
ANCA Myeloperoxidase: <0.2 units (ref 0.0–0.9)
ANCA Proteinase 3: <0.2 units (ref 0.0–0.9)
Atypical pANCA: <1:20 {titer}
Cytoplasmic (C-ANCA) Ab: <1:20 {titer}
Perinuclear (P-ANCA): <1:20 {titer}

## 2021-05-19 LAB — POCT GLUCOSE
POC Glucose: 138 mg/dL — ABNORMAL HIGH (ref 65.0–110.0)
POC Glucose: 94 mg/dL (ref 65.0–110.0)
POC Glucose: 103 mg/dL (ref 65.0–110.0)

## 2021-05-19 MED ORDER — METHYLPREDNISOLONE SODIUM SUCC 40 MG IJ SOLR
40 MG | Freq: Every day | INTRAMUSCULAR | Status: DC
Start: 2021-05-19 — End: 2021-05-19
  Administered 2021-05-19: 14:00:00 40 mg via INTRAVENOUS

## 2021-05-19 MED ORDER — MONTELUKAST SODIUM 10 MG PO TABS
10 MG | ORAL_TABLET | Freq: Every evening | ORAL | 0 refills | Status: DC
Start: 2021-05-19 — End: 2021-05-19

## 2021-05-19 MED ORDER — AMLODIPINE BESYLATE 2.5 MG PO TABS
2.5 MG | ORAL_TABLET | Freq: Every day | ORAL | 0 refills | Status: DC
Start: 2021-05-19 — End: 2021-07-27

## 2021-05-19 MED ORDER — AMLODIPINE BESYLATE 2.5 MG PO TABS
2.5 MG | ORAL_TABLET | Freq: Every day | ORAL | 0 refills | Status: DC
Start: 2021-05-19 — End: 2021-05-19

## 2021-05-19 MED ORDER — AMLODIPINE BESYLATE 2.5 MG PO TABS
2.5 MG | ORAL_TABLET | Freq: Every day | ORAL | 3 refills | Status: DC
Start: 2021-05-19 — End: 2021-05-19

## 2021-05-19 MED ORDER — SPIRIVA HANDIHALER 18 MCG IN CAPS
18 MCG | ORAL_CAPSULE | RESPIRATORY_TRACT | 0 refills | Status: DC
Start: 2021-05-19 — End: 2021-07-27

## 2021-05-19 MED ORDER — MONTELUKAST SODIUM 10 MG PO TABS
10 MG | ORAL_TABLET | Freq: Every evening | ORAL | 0 refills | Status: AC
Start: 2021-05-19 — End: 2021-08-17

## 2021-05-19 MED ORDER — PREDNISONE 20 MG PO TABS
20 MG | ORAL_TABLET | Freq: Every day | ORAL | 0 refills | Status: AC
Start: 2021-05-19 — End: 2021-05-24

## 2021-05-19 MED FILL — ENOXAPARIN SODIUM 40 MG/0.4ML IJ SOSY: 40 MG/0.4ML | INTRAMUSCULAR | Qty: 0.4

## 2021-05-19 MED FILL — IPRATROPIUM-ALBUTEROL 0.5-2.5 (3) MG/3ML IN SOLN: RESPIRATORY_TRACT | Qty: 3

## 2021-05-19 MED FILL — NORMAL SALINE FLUSH 0.9 % IV SOLN: 0.9 % | INTRAVENOUS | Qty: 10

## 2021-05-19 MED FILL — NICOTINE 14 MG/24HR TD PT24: 14 MG/24HR | TRANSDERMAL | Qty: 1

## 2021-05-19 MED FILL — BENZONATATE 100 MG PO CAPS: 100 MG | ORAL | Qty: 1

## 2021-05-19 MED FILL — MONTELUKAST SODIUM 10 MG PO TABS: 10 MG | ORAL | Qty: 1

## 2021-05-19 MED FILL — MUCINEX 600 MG PO TB12: 600 MG | ORAL | Qty: 1

## 2021-05-19 MED FILL — ATORVASTATIN CALCIUM 10 MG PO TABS: 10 MG | ORAL | Qty: 1

## 2021-05-19 MED FILL — LEVOTHYROXINE SODIUM 88 MCG PO TABS: 88 MCG | ORAL | Qty: 1

## 2021-05-19 MED FILL — TYLENOL 325 MG PO TABS: 325 MG | ORAL | Qty: 2

## 2021-05-19 MED FILL — LOSARTAN POTASSIUM 50 MG PO TABS: 50 MG | ORAL | Qty: 1

## 2021-05-19 MED FILL — OMEPRAZOLE 20 MG PO CPDR: 20 MG | ORAL | Qty: 1

## 2021-05-19 MED FILL — SOLU-MEDROL 40 MG IJ SOLR: 40 MG | INTRAMUSCULAR | Qty: 40

## 2021-05-19 MED FILL — AMLODIPINE BESYLATE 2.5 MG PO TABS: 2.5 MG | ORAL | Qty: 1

## 2021-05-19 NOTE — Progress Notes (Signed)
Melanie Sandoval is a 65 y.o. female patient of Dr. 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    Current Facility-Administered Medications   Medication Dose Route Frequency Provider Last Rate Last Admin    hydrALAZINE (APRESOLINE) injection 10 mg  10 mg IntraVENous Q6H PRN Luna Fuse, MD        benzonatate (TESSALON) capsule 100 mg  100 mg Oral TID PRN Luna Fuse, MD   100 mg at 05/18/21 1030    amLODIPine (NORVASC) tablet 2.5 mg  2.5 mg Oral Daily Luna Fuse, MD   2.5 mg at 05/18/21 0951    guaiFENesin (MUCINEX) extended release tablet 600 mg  600 mg Oral BID Luna Fuse, MD   600 mg at 05/18/21 2140    ALPRAZolam (XANAX) tablet 0.25 mg  0.25 mg Oral Nightly PRN Truett Mainland, APRN - NP        levothyroxine (SYNTHROID) tablet 88 mcg  88 mcg Oral Daily Truett Mainland, APRN - NP   88 mcg at 05/19/21 0501    losartan (COZAAR) tablet 50 mg  50 mg Oral Daily Truett Mainland, APRN - NP   50 mg at 05/18/21 0924    ipratropium-albuterol (DUONEB) nebulizer solution 1 ampule  1 ampule Inhalation Q4H WA Johnella Moloney, MD   1 ampule at 05/18/21 1940    methylPREDNISolone sodium (SOLU-MEDROL) injection 40 mg  40 mg IntraVENous Q12H Johnella Moloney, MD   40 mg at 05/18/21 2140    albuterol (PROVENTIL) nebulizer solution  15 mg/hr Nebulization Q6H PRN Rebeca Alert, MD        atorvastatin (LIPITOR) tablet 10 mg  10 mg Oral Daily Rebeca Alert, MD   10 mg at 05/18/21 2139    montelukast (SINGULAIR) tablet 10 mg  10 mg Oral Nightly Rebeca Alert, MD   10 mg at 05/18/21 2139    sodium chloride flush 0.9 % injection 5-40 mL  5-40 mL IntraVENous 2 times per day Rebeca Alert, MD   10 mL at 05/18/21 2140    sodium chloride flush 0.9 % injection 5-40 mL  5-40 mL IntraVENous PRN Rebeca Alert, MD        0.9 % sodium chloride infusion   IntraVENous PRN Rebeca Alert, MD        ondansetron (ZOFRAN-ODT) disintegrating tablet 4 mg  4 mg Oral Q8H PRN Rebeca Alert, MD         Or    ondansetron [facility]) injection 4 mg  4 mg IntraVENous Q6H PRN Rebeca Alert, MD        polyethylene glycol (GLYCOLAX) packet 17 g  17 g Oral Daily PRN Rebeca Alert, MD        acetaminophen (TYLENOL) tablet 650 mg  650 mg Oral Q6H PRN Rebeca Alert, MD   650 mg at 05/18/21 2139    Or    acetaminophen (TYLENOL) suppository 650 mg  650 mg Rectal Q6H PRN Rebeca Alert, MD        ipratropium-albuterol (DUONEB) nebulizer solution 1 ampule  1 ampule Inhalation Q4H PRN Rebeca Alert, MD   1 ampule at 05/16/21 0752    enoxaparin (LOVENOX) injection 40 mg  40 mg SubCUTAneous Daily Rebeca Alert, MD   40 mg at 05/18/21 2140    budesonide-formoterol (SYMBICORT) 160-4.5 MCG/ACT inhaler 2 puff  2 puff Inhalation BID Rebeca Alert, MD   2 puff at 05/18/21 1941  omeprazole (PRILOSEC) delayed release capsule 20 mg  20 mg Oral Daily Rebeca Alert, MD   20 mg at 05/19/21 0558    nicotine (NICODERM CQ) 14 MG/24HR 1 patch  1 patch TransDERmal Daily Rebeca Alert, MD   1 patch at 05/18/21 0925    insulin lispro (HUMALOG) injection vial 0-4 Units  0-4 Units SubCUTAneous TID WC Nihar Monia Pouch, MD        insulin lispro (HUMALOG) injection vial 0-4 Units  0-4 Units SubCUTAneous Nightly Rebeca Alert, MD        glucose chewable tablet 16 g  4 tablet Oral PRN Rebeca Alert, MD        dextrose bolus 10% 125 mL  125 mL IntraVENous PRN Rebeca Alert, MD        Or    dextrose bolus 10% 250 mL  250 mL IntraVENous PRN Rebeca Alert, MD        glucagon (rDNA) injection 1 mg  1 mg SubCUTAneous PRN Rebeca Alert, MD        dextrose 10 % infusion   IntraVENous Continuous PRN Rebeca Alert, MD         No Known Allergies  Principal Problem:    Acute respiratory failure with hypoxia (HCC)  Active Problems:    Interstitial lung disease (HCC)    Cigarette nicotine dependence without complication    COPD with acute exacerbation (HCC)    Hypomagnesemia    Acute respiratory failure with hypoxemia  (HCC)  Resolved Problems:    * No resolved hospital problems. *    Blood pressure (!) 153/96, pulse 76, temperature 97.9 ??F (36.6 ??C), temperature source Oral, resp. rate 18, height 5\' 2"  (1.575 m), weight 142 lb 13.7 oz (64.8 kg), SpO2 97 %.    Subjective  Patient feels better. She did walk some around halls again.    Objective:  Vital signs: (most recent): Blood pressure (!) 153/96, pulse 76, temperature 97.9 ??F (36.6 ??C), temperature source Oral, resp. rate 18, height 5\' 2"  (1.575 m), weight 142 lb 13.7 oz (64.8 kg), SpO2 97 %.    CV reg  Lungs with rales and wheezes  Abd soft  Ext+pulses and clubbing    Assessment & Plan  Exacerbation of interstitial lung disease- continue with current treatment including azithromycin alone for possible atypical infection, wean steroids, oxygen therapy as needed, supportive care.  I have encouraged the patient to increase activity as possible and discussed oxygen supplementation as a potential discharge therapy as well.  At this point I would anticipate her earliest discharge date to be Saturday if she progressively improves.  We will follow along during her hospital review.       , MD  05/19/2021

## 2021-05-19 NOTE — Plan of Care (Signed)
Problem: Discharge Planning  Goal: Discharge to home or other facility with appropriate resources  Outcome: Adequate for Discharge     Problem: ABCDS Injury Assessment  Goal: Absence of physical injury  Outcome: Adequate for Discharge     Problem: Skin/Tissue Integrity  Goal: Absence of new skin breakdown  Description: Absence of new skin breakdown  Outcome: Adequate for Discharge     Problem: Safety - Adult  Goal: Absence of physical injury  Description: Absence of physical injury  Outcome: Adequate for Discharge     Problem: Pain  Goal: Verbalizes/displays adequate comfort level or baseline comfort level  Outcome: Adequate for Discharge

## 2021-05-19 NOTE — Care Coordination-Inpatient (Signed)
CM spoke to pt's daughter Cordelia Pen via phone to address d/c needs and concerns. Cordelia Pen did state that she did have concerns over communication with MD. Pt has d/c orders and home o2. Pt does not have home o2 and [facility] will arrange. Cordelia Pen states that her and her mother are ready for d/c and understand the need for home o2. Pt will d/c home to [facility] house @ [address] [place], [place] [address]. CM SBAR Cindy CM/RN.

## 2021-05-19 NOTE — Discharge Summary (Signed)
Discharge Summary     Patient Identification:  Melanie Sandoval  DOB: 1956/07/03  MRN: [id number]   Account: [id number]     Admit date: 05/14/2021  Discharge date: 05/19/2021    Attending provider: Catalina Lunger, MD        Primary care provider: Dossie Der III, MD     Discharge Diagnoses:   Principal Problem:    Acute respiratory failure with hypoxia [facility])  Active Problems:    Interstitial lung disease (HCC)    Cigarette nicotine dependence without complication    COPD with acute exacerbation [facility])  Resolved Problems:    Hypomagnesemia    Acute respiratory failure with hypoxemia [facility])       Hospital Course:   Melanie Sandoval is a 65 y.o. female admitted to [facility] on 05/14/2021 for.        Acute hypoxic respiratory failure  Ms. Abramowicz was admitted 11/8 with a combination of COPD, Pulmonary fibrosis, and bronchiectasis.  He was started on empiric antibiotics including azithromycin for which she has completed a full course.  Initially received IV steroids and has transitioned to oral prednisone which she will continue at discharge for period of 5 days.  The patient did improve clinically and is not wheezing at the time of discharge.  Nevertheless she has continued to have ongoing oxygen needs with desaturations below 88% on room air.We plan discharge with home oxygen. Outpatient follow up with [facility].       Nicotine dependence  The patient had ongoing tobacco use at time of admission. She received a nicotine patch during her stay. She was counselled for smoking cessation.    HTN  Continue home losartan 50 mg daily. Added low dose amlodipine.    GERD  Continue home Prilosec     History of hypothyroidism  TSH WNL. Continue home synthroid 88 mcg.      Discharge Medications:     Medication List        START taking these medications      amLODIPine 2.5 MG tablet  Commonly known as: NORVASC  Take 1 tablet by mouth daily  Start taking on: May 20, 2021     Spiriva HandiHaler  18 MCG inhalation capsule  Generic drug: tiotropium            CHANGE how you take these medications      montelukast 10 MG tablet  Commonly known as: Singulair  Take 1 tablet by mouth nightly  What changed: Another medication with the same name was removed. Continue taking this medication, and follow the directions you see here.            CONTINUE taking these medications      albuterol sulfate HFA 108 (90 Base) MCG/ACT inhaler  Commonly known as: PROVENTIL;VENTOLIN;PROAIR     atorvastatin 10 MG tablet  Commonly known as: LIPITOR     fluticasone-salmeterol 250-50 MCG/ACT Aepb diskus inhaler  Commonly known as: ADVAIR     hydrOXYzine HCl 25 MG tablet  Commonly known as: ATARAX     levothyroxine 88 MCG tablet  Commonly known as: SYNTHROID  1 tablet daily by mouth     magnesium oxide 400 MG tablet  Commonly known as: MAG-OX     predniSONE 20 MG tablet  Commonly known as: DELTASONE  Take 2 tablets by mouth daily for 5 days     PriLOSEC OTC 20 MG tablet  Generic drug: omeprazole  STOP taking these medications      amoxicillin-clavulanate 875-125 MG per tablet  Commonly known as: Augmentin     azithromycin 250 MG tablet  Commonly known as: ZITHROMAX            ASK your doctor about these medications      losartan 50 MG tablet  Commonly known as: COZAAR     vitamin D 50 MCG (2000 UT) Caps capsule               Where to Get Your Medications        Information about where to get these medications is not yet available    Ask your nurse or doctor about these medications  amLODIPine 2.5 MG tablet         Patient Instructions:      Activity: activity as tolerated  Diet: ADULT DIET; Regular    Code Status: Full Code    Follow-up visits:   No follow-up provider specified.     Procedures:     Consults:   pulmonary/intensive care    Examination:  Vitals:  Vitals:    05/19/21 0500 05/19/21 0751 05/19/21 0752 05/19/21 0859   BP:    (!) 170/97   Pulse:  74 66 68   Resp:  Temp:    97.5 ??F (36.4 ??C)   TempSrc:     Oral   SpO2:  90% 95% 93%   Weight: 142 lb 13.7 oz (64.8 kg)      Height:         Weight: Weight: 142 lb 13.7 oz (64.8 kg)     24 hour intake/output:  Intake/Output Summary (Last 24 hours) at 05/19/2021 1148  Last data filed at 05/19/2021 0928  Gross per 24 hour   Intake 357 ml   Output 950 ml   Net -593 ml       General appearance - alert, well appearing, and in no distress  Chest - Slightly diminished air entry. No wheezes, rales or rhonchi  Heart - normal rate, regular rhythm, normal S1, S2, no murmurs, rubs, clicks or gallops  Abdomen - soft, nontender, nondistended, no masses or organomegaly  Obese: No; Protuberant: No   Neurological - alert, oriented, normal speech, no focal findings or movement disorder noted  Extremities - peripheral pulses normal, no pedal edema, no clubbing or cyanosis  Skin - normal coloration and turgor, no rashes, no suspicious skin lesions noted    Significant Diagnostics:   Radiology: XR CHEST PORTABLE    Result Date: 05/14/2021  Chest AP: 05/14/21 INDICATION: "Shortness of Breath". COMPARISON: 02/16/2021 FINDINGS: There is evidence of interstitial lung disease with reticular opacities bilaterally-stable appearance. There is no acute airspace consolidation, no pleural effusion. Heart size stable. No acute bony abnormality.     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 No acute airspace consolidation identified.    CTA PULMONARY W CONTRAST    Result Date: 05/15/2021  CTA chest: 05/14/21 INDICATION: "R/O PE. Patient with underlying ILD, presents with SOB". COMPARISON: CT 02/16/2021 TECHNIQUE: PE protocol (Postcontrast imaging from the thoracic inlet through the  hemidiaphragms in the pulmonary arterial phase. Axial 5x5 mm soft tissue and lung 2x2 mm images. Multiplanar 3-D volumetric MIP reconstructions through the pulmonary arteries per protocol.) CT scanning was performed using radiation dose  reduction techniques when appropriate, per system protocols. FINDINGS: Pulmonary  arteries: No evidence of a pulmonary embolus. Lungs/Airways: There are basilar predominant subpleural reticular  opacities bilaterally, similar to prior. Mild honeycombing in the bases. Mild traction bronchiectasis in the lower lobes. There is a background of confluent centrilobular emphysema and groundglass opacities. No new focal consolidation. There are a few sub-5 mm pulmonary nodules, similar to prior. Pleura: No pleural effusion or pneumothorax. Lymph Nodes: No mediastinal, hilar, or axillary adenopathy. Cardiovascular: Mild cardiomegaly. No pericardial effusion. Three-vessel coronary artery calcifications. Osseous structures: No suspicious lytic or blastic osseous lesions. Upper Abdomen: Small hiatal hernia.     No evidence of pulmonary embolus. 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 There is  a background of confluent centrilobular emphysema. No new focal consolidation.    Transthoracic echocardiogram (TTE) complete with contrast, bubble, strain, and 3D PRN    Result Date: 05/15/2021    Left Ventricle: Normal left ventricular systolic function with a visually estimated EF of 55 - 60%. Left ventricle size is normal. Normal wall thickness. Normal wall motion. Normal diastolic function.   Right Ventricle: Right ventricle is mildly dilated. Normal wall thickness.   Aortic Valve: Tricuspid valve.   Tricuspid Valve: Mild regurgitation with a centrally directed jet.       Labs:   Recent Results (from the past 72 hour(s))   POCT Glucose    Collection Time: 05/16/21 12:46 PM   Result Value Ref Range    POC Glucose 123.0 (H) 65.0 - 110.0 mg/dL   POCT Glucose    Collection Time: 05/16/21  5:22 PM   Result Value Ref Range    POC Glucose 114.0 (H) 65.0 - 110.0 mg/dL   POCT Glucose    Collection Time: 05/16/21  9:20 PM   Result Value Ref Range    POC Glucose 111.0 (H) 65.0 - 110.0 mg/dL   Magnesium    Collection Time: 05/17/21  6:05 AM   Result Value Ref Range    Magnesium 1.7 1.6 - 2.6  mg/dL   CBC    Collection Time: 05/17/21  6:05 AM   Result Value Ref Range    WBC 7.1 3.8 - 10.6 x10e3/mcL    RBC 4.51 3.60 - 5.20 x10e6/mcL    Hemoglobin 14.5 11.5 - 15.7 g/dL    Hematocrit 04.5 40.9 - 47.0 %    MCV 96.0 81.0 - 99.0 fL    MCH 32.2 27.0 - 34.5 pg    MCHC 33.5 32.0 - 36.0 g/dL    RDW 81.1 91.4 - 78.2 %    Platelets 160 140 - 440 x10e3/mcL    MPV 9.6 7.2 - 13.2 fL   Basic Metabolic Panel    Collection Time: 05/17/21  6:05 AM   Result Value Ref Range    Sodium 135 135 - 145 mmol/L    Potassium 5.4 (H) 3.5 - 5.3 mmol/L    Chloride 101 98 - 107 mmol/L    CO2 24 22 - 29 mmol/L    Glucose 147 (H) 70 - 99 mg/dL    BUN 20 8 - 23 mg/dL    Creatinine 0.9 0.5 - 1.0 mg/dL    Anion Gap 10 2 - 17 mmol/L    OSMOLALITY CALCULATED 275 270 - 287 mOsm/kg    Calcium 9.6 8.8 - 10.2 mg/dL    Est, Glom Filt Rate 71 (L) >=90 mL/min/1.9m??   POCT Glucose    Collection Time: 05/17/21  8:22 AM   Result Value Ref Range    POC Glucose 127.0 (H) 65.0 - 110.0 mg/dL   ANCA Panel    Collection Time: 05/17/21  10:28 AM   Result Value Ref Range    ANCA Myeloperoxidase <0.2 0.0 - 0.9 units    ANCA Proteinase 3 <0.2 0.0 - 0.9 units    Perinuclear (P-ANCA) <1:20 Neg:<1:20 titer    Cytoplasmic (C-ANCA) Ab <1:20 Neg:<1:20 titer    Atypical pANCA <1:20 Neg:<1:20 titer   Sjogren's Ab (SS-A, SS-B)    Collection Time: 05/17/21 10:28 AM   Result Value Ref Range    Sjogren's Antibodies (SSA) <0.2 0.0 - 0.9 AI    Sjogren's Antibodies (SSB) <0.2 0.0 - 0.9 AI   ANA    Collection Time: 05/17/21 10:28 AM   Result Value Ref Range    ANA by IFA Negative    Anti-Scleroderma Antibody    Collection Time: 05/17/21 10:28 AM   Result Value Ref Range    Scleroderma SCL-70 <0.2 0.0 - 0.9 AI   POCT Glucose    Collection Time: 05/17/21 12:12 PM   Result Value Ref Range    POC Glucose 119.0 (H) 65.0 - 110.0 mg/dL   POCT Glucose    Collection Time: 05/17/21  5:28 PM   Result Value Ref Range    POC Glucose 151.0 (H) 65.0 - 110.0 mg/dL   POCT Glucose    Collection  Time: 05/17/21  9:06 PM   Result Value Ref Range    POC Glucose 128.0 (H) 65.0 - 110.0 mg/dL   Basic Metabolic Panel w/ Reflex to MG    Collection Time: 05/18/21  2:13 AM   Result Value Ref Range    Sodium 136 135 - 145 mmol/L    Potassium 4.8 3.5 - 5.3 mmol/L    Chloride 101 98 - 107 mmol/L    CO2 23 22 - 29 mmol/L    Glucose 126 (H) 70 - 99 mg/dL    BUN 24 (H) 8 - 23 mg/dL    Creatinine 0.9 0.5 - 1.0 mg/dL    Anion Gap 12 2 - 17 mmol/L    OSMOLALITY CALCULATED 277 270 - 287 mOsm/kg    Calcium 10.0 8.8 - 10.2 mg/dL    Est, Glom Filt Rate 71 (L) >=90 mL/min/1.67m??   CBC    Collection Time: 05/18/21  2:13 AM   Result Value Ref Range    WBC 9.2 3.8 - 10.6 x10e3/mcL    RBC 4.83 3.60 - 5.20 x10e6/mcL    Hemoglobin 15.4 11.5 - 15.7 g/dL    Hematocrit 27.2 53.6 - 47.0 %    MCV 94.6 81.0 - 99.0 fL    MCH 31.9 27.0 - 34.5 pg    MCHC 33.7 32.0 - 36.0 g/dL    RDW 64.4 03.4 - 74.2 %    Platelets 181 140 - 440 x10e3/mcL    MPV 9.5 7.2 - 13.2 fL   POCT Glucose    Collection Time: 05/18/21  8:49 AM   Result Value Ref Range    POC Glucose 102.0 65.0 - 110.0 mg/dL   POCT Glucose    Collection Time: 05/18/21 12:53 PM   Result Value Ref Range    POC Glucose 113.0 (H) 65.0 - 110.0 mg/dL   POCT Glucose    Collection Time: 05/18/21  5:45 PM   Result Value Ref Range    POC Glucose 159.0 (H) 65.0 - 110.0 mg/dL   POCT Glucose    Collection Time: 05/18/21  9:26 PM   Result Value Ref Range    POC Glucose 138.0 (H) 65.0 - 110.0 mg/dL   CBC with Auto Differential  Collection Time: 05/19/21  6:27 AM   Result Value Ref Range    WBC 6.4 3.8 - 10.6 x10e3/mcL    RBC 4.47 3.60 - 5.20 x10e6/mcL    Hemoglobin 14.2 11.5 - 15.7 g/dL    Hematocrit 29.4 76.5 - 47.0 %    MCV 94.4 81.0 - 99.0 fL    MCH 31.8 27.0 - 34.5 pg    MCHC 33.6 32.0 - 36.0 g/dL    RDW 46.5 03.5 - 46.5 %    Platelets 159 140 - 440 x10e3/mcL    MPV 9.6 7.2 - 13.2 fL    Neutrophils % 77.5 (H) 42.0 - 74.0 %    Lymphocytes 16.3 15.0 - 45.0 %    Monocytes 5.7 4.0 - 12.0 %    Eosinophils  % 0.2 0.0 - 7.0 %    Basophils % 0.0 0.0 - 2.0 %    Neutrophils Absolute 4.9 1.6 - 7.3 x10e3/mcL    Absolute Lymph # 1.0 1.0 - 3.2 x10e3/mcL    Absolute Mono # 0.4 0.3 - 1.0 x10e3/mcL    Absolute Eos # 0.0 0.0 - 0.5 x10e3/mcL    Absolute Baso # 0.0 0.0 - 0.2 x10e3/mcL    Immature Granulocytes 0.3 0.0 - 0.6 %    Immature Grans (Abs) 0.02 0.00 - 0.06 x10e3/mcL   Basic Metabolic Panel w/ Reflex to MG    Collection Time: 05/19/21  6:27 AM   Result Value Ref Range    Sodium 134 (L) 135 - 145 mmol/L    Potassium 4.4 3.5 - 5.3 mmol/L    Chloride 99 98 - 107 mmol/L    CO2 20 (L) 22 - 29 mmol/L    Glucose 125 (H) 70 - 99 mg/dL    BUN 27 (H) 8 - 23 mg/dL    Creatinine 0.8 0.5 - 1.0 mg/dL    Anion Gap 15 2 - 17 mmol/L    OSMOLALITY CALCULATED 275 270 - 287 mOsm/kg    Calcium 9.6 8.8 - 10.2 mg/dL    Est, Glom Filt Rate 82 (L) >=90 mL/min/1.48m??   POCT Glucose    Collection Time: 05/19/21  8:58 AM   Result Value Ref Range    POC Glucose 94.0 65.0 - 110.0 mg/dL       Discharge condition: fair  Disposition: Home with oxygen therapy    Time spent on discharge:     Electronically signed by Catalina Lunger, MD on 05/19/21 at 11:48 AM EST

## 2021-05-20 LAB — CYCLIC CITRUL PEPTIDE ANTIBODY, IGG: CCP Antibodies IgG/IgA: 2 units (ref 0–19)

## 2021-05-29 MED ORDER — LEVOTHYROXINE SODIUM 88 MCG PO TABS
88 MCG | ORAL_TABLET | ORAL | 0 refills | Status: DC
Start: 2021-05-29 — End: 2021-06-29

## 2021-06-28 NOTE — Telephone Encounter (Signed)
levothyroxine (SYNTHROID) 88 MCG tablet, 1 a day, 90ds      montelukast (SINGULAIR) 10 MG tablet,1 a day, 90ds        [employer] [facility] delivery

## 2021-06-29 MED ORDER — LEVOTHYROXINE SODIUM 88 MCG PO TABS
88 MCG | ORAL_TABLET | ORAL | 3 refills | Status: AC
Start: 2021-06-29 — End: 2021-07-27

## 2021-06-29 MED ORDER — MONTELUKAST SODIUM 10 MG PO TABS
10 MG | ORAL_TABLET | Freq: Every evening | ORAL | 3 refills | Status: AC
Start: 2021-06-29 — End: 2022-06-24

## 2021-07-27 ENCOUNTER — Ambulatory Visit: Admit: 2021-07-27 | Discharge: 2021-07-27 | Payer: MEDICARE | Attending: Family Medicine | Primary: Family Medicine

## 2021-07-27 DIAGNOSIS — J9611 Chronic respiratory failure with hypoxia: Secondary | ICD-10-CM

## 2021-07-27 MED ORDER — LEVOTHYROXINE SODIUM 88 MCG PO TABS
88 MCG | ORAL_TABLET | ORAL | 3 refills | Status: AC
Start: 2021-07-27 — End: 2022-06-18

## 2021-07-27 NOTE — Progress Notes (Signed)
CHIEF COMPLAINT:  Chief Complaint   Patient presents with    New Patient     Meet and greet   Refills   Mammo order placed  Pt has not had labs done         HISTORY OF PRESENT ILLNESS:  Ms. Melanie SchillingSummers is a 66 y.o. female  who presents as a new patient.   Chronic resp failure, COPD, Pulm Fibrosis- She is now on 2-3L of o2. Follows with Dr Charlie Pitterashad, uses her inhalers and stopped smoking. She had been fine until hospitalized in 11/22. She had recently been on steroids and abx.   HTN- Had been controlled on Losartan 50mg  daily.   Hypothyroidism- TSH was nl in 11/22. Cont on synthroid  Adjustment disorder- Stressed given how things have changed as unable to work anymore at [employer] and then being on o2 and life changing.   HPL- Lipids reviewed from 8/22 and stable. Cont on statin.     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   PHQ:  PHQ-9  07/27/2021   Little interest or pleasure in doing things 0   Feeling down, depressed, or hopeless 1   PHQ-2 Score 1   PHQ-9 Total Score 1       CURRENT MEDICATION LIST:    Current Outpatient Medications   Medication Sig Dispense Refill    levothyroxine (SYNTHROID) 88 MCG tablet 1 tablet daily by mouth 90 tablet 3    montelukast (SINGULAIR) 10 MG tablet Take 1 tablet by mouth nightly 90 tablet 3    ipratropium-albuterol (DUONEB) 0.5-2.5 (3) MG/3ML SOLN nebulizer solution USE 3 ML IN NEBULIZER EVERY 6 HOURS FOR 30 DAYS      SPIRIVA RESPIMAT 2.5 MCG/ACT AERS inhaler       albuterol sulfate HFA (PROVENTIL;VENTOLIN;PROAIR) 108 (90 Base) MCG/ACT inhaler 1 puff as needed Inhalation every 4 hrs      atorvastatin (LIPITOR) 10 MG tablet Take 10 mg by mouth daily      fluticasone-salmeterol (ADVAIR) 250-50 MCG/ACT AEPB diskus inhaler Inhale into the lungs 2 times daily      hydrOXYzine HCl (ATARAX) 25 MG tablet Take 25 mg by mouth every 8 hours as needed      losartan (COZAAR) 50 MG tablet Take 50 mg by mouth daily      magnesium oxide (MAG-OX) 400 MG tablet Take 400 mg by mouth daily      Cholecalciferol (VITAMIN D)  50 MCG (2000 UT) CAPS capsule Take 2,000 Units by mouth daily      omeprazole (PRILOSEC OTC) 20 MG tablet Take 20 mg by mouth daily       No current facility-administered medications for this visit.        ALLERGIES:    No Known Allergies     HISTORY:  Past Medical History:   Diagnosis Date    Asthma     COPD (chronic obstructive pulmonary disease) (HCC)     Hyperlipidemia     Hypertension     Hypothyroidism     Pulmonary fibrosis (HCC)       Past Surgical History:   Procedure Laterality Date    ELBOW SURGERY Left     KNEE ARTHROSCOPY Right     THYROID SURGERY        Social History     Socioeconomic History    Marital status: Single     Spouse name: Not on file    Number of children: Not on file    Years of education: Not  on file    Highest education level: Not on file   Occupational History    Not on file   Tobacco Use    Smoking status: Former     Packs/day: 1.00     Years: 50.00     Pack years: 50.00     Types: Cigarettes    Smokeless tobacco: Never   Vaping Use    Vaping Use: Never used   Substance and Sexual Activity    Alcohol use: Never    Drug use: Never    Sexual activity: Not on file   Other Topics Concern    Not on file   Social History Narrative    Not on file     Social Determinants of Health     Financial Resource Strain: Not on file   Food Insecurity: Not on file   Transportation Needs: Not on file   Physical Activity: Not on file   Stress: Not on file   Social Connections: Not on file   Intimate Partner Violence: Not on file   Housing Stability: Not on file      Family History   Problem Relation Age of Onset    Heart Attack Father         Review of Systems   Constitutional:  Negative for chills, fatigue and fever.   HENT:  Negative for hearing loss, rhinorrhea, sore throat and trouble swallowing.    Eyes:  Negative for visual disturbance.   Respiratory:  Negative for shortness of breath.         O2- 2-3L    Cardiovascular:  Negative for chest pain and leg swelling.   Gastrointestinal:  Negative for  abdominal pain, diarrhea, nausea and vomiting.   Genitourinary:  Negative for dysuria.   Musculoskeletal:  Negative for arthralgias and gait problem.   Neurological:  Negative for dizziness and syncope.   Psychiatric/Behavioral:  Negative for dysphoric mood.       PHYSICAL EXAM:  Vital Signs -   Visit Vitals  BP 92/64   Pulse 86   Temp 97.5 ??F (36.4 ??C)   Resp 16   Ht 5\' 2"  (1.575 m)   Wt 145 lb 14.4 oz (66.2 kg)   SpO2 94%   BMI 26.69 kg/m??        Physical Exam  Vitals and nursing note reviewed.   Constitutional:       General: She is not in acute distress.     Appearance: Normal appearance. She is normal weight.   HENT:      Head: Normocephalic and atraumatic.      Mouth/Throat:      Mouth: Mucous membranes are moist.      Pharynx: Oropharynx is clear. No oropharyngeal exudate or posterior oropharyngeal erythema.   Eyes:      Extraocular Movements: Extraocular movements intact.      Conjunctiva/sclera: Conjunctivae normal.      Pupils: Pupils are equal, round, and reactive to light.   Neck:      Vascular: No carotid bruit.   Cardiovascular:      Rate and Rhythm: Normal rate and regular rhythm.      Heart sounds: Normal heart sounds. No murmur heard.  Pulmonary:      Effort: Pulmonary effort is normal. No respiratory distress.      Comments: Decreased breath sounds bilaterally,   On 3L O2  Abdominal:      General: Bowel sounds are normal. There is no distension.  Palpations: Abdomen is soft.      Tenderness: There is no abdominal tenderness.   Musculoskeletal:         General: No swelling or tenderness. Normal range of motion.      Cervical back: Normal range of motion and neck supple.   Skin:     General: Skin is warm and dry.      Findings: No erythema or rash.   Neurological:      General: No focal deficit present.      Mental Status: She is alert and oriented to person, place, and time. Mental status is at baseline.   Psychiatric:         Mood and Affect: Mood normal.         Behavior: Behavior normal.          Thought Content: Thought content normal.         Judgment: Judgment normal.        LABS  No results found for this visit on 07/27/21.  No results displayed because visit has over 200 results.          IMPRESSION/PLAN    1. Chronic respiratory failure with hypoxia (HCC)  Cont with Pulm followup and cont to work on recuperation. Cont on home O2, recent exacerbation improving.     2. Hypothyroidism, unspecified type  Recheck labs, cont on current regimen as noted normal in last hospitalization  - levothyroxine (SYNTHROID) 88 MCG tablet; 1 tablet daily by mouth  Dispense: 90 tablet; Refill: 3    3. Interstitial lung disease (HCC)  Improving, cont to follow with Pulm.     4. Primary hypertension  Controlled but noted slightly lower today- family only giving losartan but will monitor BP and if running low, will hold losartan. Cont to hydrate well.     5. Encounter for screening mammogram for malignant neoplasm of breast  - MAM TOMO DIGITAL SCREEN BILATERAL; Future    6. Physical exam  Screening labs for upcoming AWV in 6 months.   - CBC with Auto Differential; Future  - Comprehensive Metabolic Panel; Future  - Lipid Panel; Future  - TSH with Reflex; Future          Follow up and Dispositions:  Return for 3 months with jasmie, 6 months with Dr Herma Carson for AWV and blood work.       Derek Mound, MD

## 2021-08-21 NOTE — Telephone Encounter (Signed)
Unable to reach patient to schedule follow up with NP, [facility]

## 2021-09-04 NOTE — Telephone Encounter (Signed)
Patient just going to wait to see Dr.Z in July

## 2021-10-03 ENCOUNTER — Inpatient Hospital Stay: Admit: 2021-10-03 | Payer: MEDICARE | Primary: Family Medicine

## 2021-10-03 DIAGNOSIS — Z1231 Encounter for screening mammogram for malignant neoplasm of breast: Secondary | ICD-10-CM

## 2021-10-05 ENCOUNTER — Ambulatory Visit: Admit: 2021-10-05 | Discharge: 2021-10-05 | Payer: MEDICARE | Attending: Family | Primary: Family Medicine

## 2021-10-05 DIAGNOSIS — M79641 Pain in right hand: Secondary | ICD-10-CM

## 2021-10-05 MED ORDER — DICLOFENAC SODIUM 1 % EX GEL
1 % | Freq: Four times a day (QID) | CUTANEOUS | 3 refills | Status: AC | PRN
Start: 2021-10-05 — End: ?

## 2021-10-05 MED ORDER — MUPIROCIN 2 % EX OINT
2 % | Freq: Two times a day (BID) | CUTANEOUS | 1 refills | Status: AC
Start: 2021-10-05 — End: 2021-10-12

## 2021-10-05 NOTE — Progress Notes (Unsigned)
CHIEF COMPLAINT:  Chief Complaint   Patient presents with    Other     Has a concerning spot on left leg that is swollen and tender to the touch started within last few weeks.  Has had it for a month  Also pain in her hands and toe nail fungus         HISTORY OF PRESENT ILLNESS:  Melanie Sandoval is a 66 y.o. female  who presents today for "bump" to her left lower leg for a few weeks. Notes the area started as a small bump and then grew in size and is now red and slightly tender to touch. She denies drainage. She has not been putting anything on the area. She had MRSA years ago. No fever. She also has concerns about her right great toenail. Notes she has had intermittent fungus to this toenail for about a year. She has used many fungal creams which work for a little while; but, then it comes back. She even "dung" half of the toenail out before to see if the nail would grow back healthy; but, the fungus returned. She denies pain or drainage. She also has concerns about her hands hurting. She has been diagnosed with arthritis and has had bilateral carpal tunnel surgery. She used to do many activities with her hands. She has been applying a family member's Diclofenac cream to her hands and soaking them in warm water which does help. She also does exercises with them to help with the stiffness. Once she gets going in the morning her hands are good for the rest of the day. It's mainly in the morning when she has this. She denies any recent injury. No swelling.     She also wants to update [place] on her Bps. Her daughter states she notified MD about discontinuing her Cozaar as she was feeling "shaky" and having low Bps (90s-100s/50-60s). She has been monitoring her Bps and they have been 110s-120s/80s and the "shaky" feeling has resolved after discontinuation of the Cozaar. She is keeping off of the Cozaar for now as this seemed to resolve the issue.     PHQ:  PHQ-9  10/05/2021   Little interest or pleasure in doing things 0    Feeling down, depressed, or hopeless 0   PHQ-2 Score 0   PHQ-9 Total Score 0       CURRENT MEDICATION LIST:    Current Outpatient Medications   Medication Sig Dispense Refill    BREZTRI AEROSPHERE 160-9-4.8 MCG/ACT AERO INHALE 2 PUFFS TWICE DAILY FOR 90 DAYS      predniSONE (DELTASONE) 10 MG tablet TAKE 4 TABLETS BY MOUTH ONCE DAILY FOR 3 DAYS, 3 ONCE DAILY FOR 3 DAYS, 2 ONCE DAILY FOR 3 DAYS, 1 ONCE DAILY FOR 3 DAYS, 1/2 (ONE-HALF) FOR 3 DAYS FOR A TOTAL OF 15 DAYS.      levothyroxine (SYNTHROID) 88 MCG tablet 1 tablet daily by mouth 90 tablet 3    ipratropium-albuterol (DUONEB) 0.5-2.5 (3) MG/3ML SOLN nebulizer solution USE 3 ML IN NEBULIZER EVERY 6 HOURS FOR 30 DAYS      montelukast (SINGULAIR) 10 MG tablet Take 1 tablet by mouth nightly 90 tablet 3    albuterol sulfate HFA (PROVENTIL;VENTOLIN;PROAIR) 108 (90 Base) MCG/ACT inhaler 1 puff as needed Inhalation every 4 hrs      atorvastatin (LIPITOR) 10 MG tablet Take 10 mg by mouth daily      hydrOXYzine HCl (ATARAX) 25 MG tablet Take 25 mg by mouth every  8 hours as needed      magnesium oxide (MAG-OX) 400 MG tablet Take 400 mg by mouth daily      Cholecalciferol (VITAMIN D) 50 MCG (2000 UT) CAPS capsule Take 2,000 Units by mouth daily      omeprazole (PRILOSEC OTC) 20 MG tablet Take 20 mg by mouth daily      SPIRIVA RESPIMAT 2.5 MCG/ACT AERS inhaler       fluticasone-salmeterol (ADVAIR) 250-50 MCG/ACT AEPB diskus inhaler Inhale into the lungs 2 times daily      losartan (COZAAR) 50 MG tablet Take 50 mg by mouth daily       No current facility-administered medications for this visit.        ALLERGIES:    Allergies   Allergen Reactions    Other         HISTORY:  Past Medical History:   Diagnosis Date    Asthma     COPD (chronic obstructive pulmonary disease) (HCC)     Hyperlipidemia     Hypertension     Hypothyroidism     Pulmonary fibrosis (HCC)       Past Surgical History:   Procedure Laterality Date    ELBOW SURGERY Left     KNEE ARTHROSCOPY Right     THYROID  SURGERY             Family History   Problem Relation Age of Onset    Heart Attack Father     Breast Cancer Maternal Aunt         REVIEW OF SYSTEMS:  Review of systems is as indicated in HPI otherwise negative.    PHYSICAL EXAM: Physical Exam  Constitutional:       Appearance: Normal appearance. She is normal weight.   Cardiovascular:      Rate and Rhythm: Normal rate and regular rhythm.   Pulmonary:      Effort: Pulmonary effort is normal.      Breath sounds: Normal breath sounds.   Abdominal:      General: Abdomen is flat. Bowel sounds are normal.      Palpations: Abdomen is soft.   Musculoskeletal:         General: Normal range of motion.      Comments: Right great toenail with greenish/yellowish discoloration to 1/3 of nail, no drainage or erythema    Skin:     General: Skin is warm and dry.      Findings: Lesion present.      Comments: ~ 1 cm slightly erythematous, raised lesion noted to left shin, no tenderness, no drainage, no fluctuance    Neurological:      General: No focal deficit present.      Mental Status: She is alert and oriented to person, place, and time. Mental status is at baseline.   Psychiatric:         Mood and Affect: Mood normal.      Vital Signs -   Visit Vitals  BP 118/80   Pulse 75   Temp 97.6 ??F (36.4 ??C) (Oral)   Resp 18   Wt 146 lb 4.8 oz (66.4 kg)   SpO2 90%   BMI 26.76 kg/m??          IMPRESSION/PLAN    1. Bilateral hand pain  -Hand pain likely due to arthritis and overuse over the years, Will prescribe Diclofenac as this seems to be helping. She can also do warm soaks with Epsom salt. She has done  OT in the past and declines this today. She is to notify for concerns for worsening, decrease in range of motion, or any other issues.  - diclofenac sodium (VOLTAREN) 1 % GEL; Apply 2 g topically 4 times daily as needed for Pain  Dispense: 100 g; Refill: 3    2. Skin lesion  -Area of concern does not require oral antibiotics at this time. Appears to be more superficial on exam today. Will  apply mupirocin ointment and monitor. She is to notify if increasing in size, redness, fever, drainage or any other issues.  - mupirocin (BACTROBAN) 2 % ointment; Apply topically 2 times daily for 7 days  Dispense: 22 g; Refill: 1    3. Onychomycosis  -Right great toenail consistent with fungal infection.  She has tried multiple fungal creams with intermittent improvement. No signs or symptoms of infection on exam, antibiotics not needed at this time. Will place podiatry referral as noted.  - External Referral To Podiatry    4. Primary hypertension  -Shakiness seems to have resolved after discontinuation of Cozaar. She has been monitoring her blood pressures at home. Bps have improved. We will continue to monitor blood pressures and she is to notify for increase in blood pressures. At this time she can remain off of Cozaar.       Follow up and Dispositions:  -Follow-up on January 25, 2022, sooner if needed    Everitt Amber, APRN - NP

## 2021-10-31 MED ORDER — HYDROXYZINE HCL 25 MG PO TABS
25 MG | ORAL_TABLET | Freq: Three times a day (TID) | ORAL | 2 refills | Status: AC | PRN
Start: 2021-10-31 — End: 2022-01-28

## 2022-01-05 ENCOUNTER — Encounter

## 2022-01-05 MED ORDER — MONTELUKAST SODIUM 10 MG PO TABS
10 MG | ORAL_TABLET | Freq: Every evening | ORAL | 3 refills | Status: AC
Start: 2022-01-05 — End: 2022-12-31

## 2022-01-05 MED ORDER — ATORVASTATIN CALCIUM 10 MG PO TABS
10 MG | ORAL_TABLET | Freq: Every day | ORAL | 3 refills | Status: DC
Start: 2022-01-05 — End: 2022-06-18

## 2022-01-10 ENCOUNTER — Encounter

## 2022-01-10 LAB — COMPREHENSIVE METABOLIC PANEL
ALT: 33 U/L (ref 0–35)
AST: 26 U/L (ref 0–35)
Albumin/Globulin Ratio: 2 (ref 1.00–2.70)
Albumin: 4.5 g/dL (ref 3.5–5.2)
Alk Phosphatase: 81 U/L (ref 35–117)
Anion Gap: 14 mmol/L (ref 2–17)
BUN: 20 mg/dL (ref 8–23)
CO2: 24 mmol/L (ref 22–29)
Calcium: 9.6 mg/dL (ref 8.8–10.2)
Chloride: 101 mmol/L (ref 98–107)
Creatinine: 1 mg/dL (ref 0.5–1.0)
Est, Glom Filt Rate: 62 mL/min/1.73m (ref >=60)
Globulin: 2.2 g/dL (ref 1.9–4.4)
Glucose: 133 mg/dL — ABNORMAL HIGH (ref 70–99)
OSMOLALITY CALCULATED: 282 mOsm/kg (ref 270–287)
Potassium: 4.3 mmol/L (ref 3.5–5.3)
Sodium: 139 mmol/L (ref 135–145)
Total Bilirubin: 0.4 mg/dL (ref 0.00–1.20)
Total Protein: 6.7 g/dL (ref 6.4–8.3)

## 2022-01-10 LAB — CBC WITH AUTO DIFFERENTIAL
Absolute Baso #: 0 10*3/uL (ref 0.0–0.2)
Absolute Eos #: 0.1 10*3/uL (ref 0.0–0.5)
Absolute Lymph #: 1.2 10*3/uL (ref 1.0–3.2)
Absolute Mono #: 0.4 10*3/uL (ref 0.3–1.0)
Basophils %: 0.2 % (ref 0.0–2.0)
Eosinophils %: 0.4 % (ref 0.0–7.0)
Hematocrit: 39.8 % (ref 34.0–47.0)
Hemoglobin: 13.4 g/dL (ref 11.5–15.7)
Immature Grans (Abs): 0.12 10*3/uL — ABNORMAL HIGH (ref 0.00–0.06)
Immature Granulocytes: 0.8 % — ABNORMAL HIGH (ref 0.0–0.6)
Lymphocytes: 8.2 % — ABNORMAL LOW (ref 15.0–45.0)
MCH: 31.8 pg (ref 27.0–34.5)
MCHC: 33.7 g/dL (ref 32.0–36.0)
MCV: 94.3 fL (ref 81.0–99.0)
MPV: 9.9 fL (ref 7.2–13.2)
Monocytes: 2.6 % — ABNORMAL LOW (ref 4.0–12.0)
NRBC Absolute: 0 10*3/uL (ref 0.000–0.012)
NRBC Automated: 0 % (ref 0.0–0.2)
Neutrophils %: 87.8 % — ABNORMAL HIGH (ref 42.0–74.0)
Neutrophils Absolute: 13.2 10*3/uL — ABNORMAL HIGH (ref 1.6–7.3)
Platelets: 222 10*3/uL (ref 140–440)
RBC: 4.22 x10e6/mcL (ref 3.60–5.20)
RDW: 12.7 % (ref 11.0–16.0)
WBC: 15 10*3/uL — ABNORMAL HIGH (ref 3.8–10.6)

## 2022-01-10 LAB — ALLERGEN, RESPIRATORY, REGION 5 PANEL
ALLERGEN PENICILLIUM NOTATUM: <0.1 kU/L (ref 0.00–0.35)
Allergen Birch IgE: <0.1 kU/L (ref 0.00–0.35)
Allergen, Grass, Bahia, IgE: <0.1 kU/L (ref 0.00–0.35)
Aspergillus alternata IgE: <0.1 kU/L (ref 0.00–0.35)
Aspergillus fumigatus IgE: <0.1 kU/L (ref 0.00–0.35)
Bermuda Grass IgE: <0.1 kU/L (ref 0.00–0.35)
Cat Dander IgE: <0.1 kU/L (ref 0.00–0.35)
Cladosporium Herbarum IgE: <0.1 kU/L (ref 0.00–0.35)
Common-Short Ragweed IgE: <0.1 kU/L (ref 0.00–0.35)
D. farinae IgE: <0.1 kU/L (ref 0.00–0.35)
Dog Dander IgE: <0.1 kU/L (ref 0.00–0.35)
Elm IgE: <0.1 kU/L (ref 0.00–0.35)
German Cockroach IgE: <0.1 kU/L (ref 0.00–0.35)
Immunoglobulin E: 119 kU/L — ABNORMAL HIGH (ref 0.00–100.00)
Mite Dust Pteronyssinus IgE: <0.1 kU/L (ref 0.00–0.35)
Mouse Urine Proteins: <0.1 kU/L (ref 0.00–0.35)
Nettle: <0.1 kU/L (ref 0.00–0.35)
Oak: <0.1 kU/L (ref 0.00–0.35)
Pecan Tree IgE: <0.1 kU/L (ref 0.00–0.35)
Rough Pigweed  IgE: <0.1 kU/L (ref 0.00–0.35)
Sheep Sorrel IgE: <0.1 kU/L (ref 0.00–0.35)
Timothy Grass, IgE: <0.1 kU/L (ref 0.00–0.35)

## 2022-01-10 LAB — TSH WITH REFLEX: TSH: 1.25 mcIU/mL (ref 0.358–3.740)

## 2022-01-10 LAB — LIPID PANEL
Chol/HDL Ratio: 2.9 (ref 0.0–4.4)
Cholesterol: 176 mg/dL (ref 100–200)
HDL: 60 mg/dL (ref >=50)
LDL Cholesterol: 73 mg/dL (ref 0.0–100.0)
LDL/HDL Ratio: 1.2
Triglycerides: 215 mg/dL — ABNORMAL HIGH (ref 0–149)
VLDL: 43 mg/dL — ABNORMAL HIGH (ref 5.0–40.0)

## 2022-01-10 LAB — HEPATIC FUNCTION PANEL
ALT: 35 U/L (ref 0–35)
AST: 26 U/L (ref 0–35)
Albumin: 4.6 g/dL (ref 3.5–5.2)
Alk Phosphatase: 82 U/L (ref 35–117)
Bilirubin, Direct: <0.2 mg/dL (ref 0.00–0.30)
Total Bilirubin: 0.39 mg/dL (ref 0.00–1.20)
Total Protein: 6.8 g/dL (ref 6.4–8.3)

## 2022-01-11 ENCOUNTER — Encounter

## 2022-01-11 LAB — PTT: PTT: 31 seconds (ref 23.3–34.5)

## 2022-01-11 LAB — PROTIME-INR
INR: 1 — ABNORMAL LOW (ref 1.5–3.5)
Protime: 13.2 seconds (ref 11.6–14.5)

## 2022-01-13 LAB — NASH FIBROSURE PLUS
ALT (SGPT) P5P: 37 IU/L (ref 0–40)
AST (SGOT) P5P: 30 IU/L (ref 0–40)
Alpha 2-Macroglobulins, Qn: 225 mg/dL (ref 110–276)
Apolipoprotein A-1: 161 mg/dL (ref 116–209)
BILIRUBIN, TOTAL (NASH), 13917: 0.2 mg/dL (ref 0.0–1.2)
Cholesterol, Total  (NASH): 185 mg/dL (ref 100–199)
Fibrosis Score NASH Plus: 0.24 — ABNORMAL HIGH (ref 0.00–0.21)
GGT: 239 IU/L — ABNORMAL HIGH (ref 0–60)
Haptoglobin: 230 mg/dL (ref 37–355)
NASH Glucose Serum: 124 mg/dL — ABNORMAL HIGH (ref 70–99)
NASH Score Plus: 0.63 — ABNORMAL HIGH (ref 0.00–0.25)
Steatosis Score NASH Plus: 0.85 — ABNORMAL HIGH (ref 0.00–0.40)
Triglycerides: 161 mg/dL — ABNORMAL HIGH (ref 0–149)

## 2022-01-20 ENCOUNTER — Ambulatory Visit: Payer: MEDICARE | Primary: Family Medicine

## 2022-01-20 DIAGNOSIS — K7460 Unspecified cirrhosis of liver: Secondary | ICD-10-CM

## 2022-01-20 DIAGNOSIS — R161 Splenomegaly, not elsewhere classified: Secondary | ICD-10-CM

## 2022-01-23 ENCOUNTER — Encounter

## 2022-01-25 ENCOUNTER — Ambulatory Visit: Admit: 2022-01-25 | Discharge: 2022-01-25 | Payer: MEDICARE | Attending: Family Medicine | Primary: Family Medicine

## 2022-01-25 DIAGNOSIS — Z0000 Encounter for general adult medical examination without abnormal findings: Secondary | ICD-10-CM

## 2022-01-25 LAB — AMB POC HEMOGLOBIN A1C: Hemoglobin A1C, POC: 5.4 %

## 2022-01-25 MED ORDER — TRAZODONE HCL 100 MG PO TABS
100 MG | ORAL_TABLET | Freq: Every evening | ORAL | 0 refills | Status: DC | PRN
Start: 2022-01-25 — End: 2022-05-16

## 2022-01-25 NOTE — Patient Instructions (Signed)
Learning About Dental Care for Older Adults  Dental care for older adults: Overview  Dental care for older people is much the same as for younger adults. But older adults do have concerns that younger adults do not. Older adults may have problems with gum disease and decay on the roots of their teeth. They may need missing teeth replaced or broken fillings fixed. Or they may have dentures that need to be cared for. Some older adults may have trouble holding a toothbrush.  You can help remind the person you are caring for to brush and floss their teeth or to clean their dentures. In some cases, you may need to do the brushing and other dental care tasks. People who have trouble using their hands or who have dementia may need this extra help.  How can you help with dental care?  Normal dental care  To keep the teeth and gums healthy:  Brush the teeth with fluoride toothpaste twice a day--in the morning and at night--and floss at least once a day. Plaque can quickly build up on the teeth of older adults.  Watch for the signs of gum disease. These signs include gums that bleed after brushing or after eating hard foods, such as apples.  See a dentist regularly. Many experts recommend checkups every 6 months.  Keep the dentist up to date on any new medications the person is taking.  Encourage a balanced diet that includes whole grains, vegetables, and fruits, and that is low in saturated fat and sodium.  Encourage the person you're caring for not to use tobacco products. They can affect dental and general health.  Many older adults have a fixed income and feel that they can't afford dental care. But most towns and cities have programs in which dentists help older adults by lowering fees. Contact your area's public health offices or social services for information about dental care in your area.  Using a toothbrush  Older adults with arthritis sometimes have trouble brushing their teeth because they can't easily hold  the toothbrush. Their hands and fingers may be stiff, painful, or weak. If this is the case, you can:  Offer an IT trainer toothbrush.  Enlarge the handle of a non-electric toothbrush by wrapping a sponge, an elastic bandage, or adhesive tape around it.  Push the toothbrush handle through a ball made of rubber or soft foam.  Make the handle longer and thicker by taping Popsicle sticks or tongue depressors to it.  You may also be able to buy special toothbrushes, toothpaste dispensers, and floss holders.  Your doctor may recommend a soft-bristle toothbrush if the person you care for bleeds easily. Bleeding can happen because of a health problem or from certain medicines.  A toothpaste for sensitive teeth may help if the person you care for has sensitive teeth.  How do you brush and floss someone's teeth?  If the person you are caring for has a hard time cleaning their teeth on their own, you may need to brush and floss their teeth for them. It may be easiest to have the person sit and face away from you, and to sit or stand behind them. That way you can steady their head against your arm as you reach around to floss and brush their teeth. Choose a place that has good lighting and is comfortable for both of you.  Before you begin, gather your supplies. You will need gloves, floss, a toothbrush, and a container to hold water if you  are not near a sink. Wash and dry your hands well and put on gloves. Start by flossing:  Gently work a piece of floss between each of the teeth toward the gums. A plastic flossing tool may make this easier, and they are available at most drugstores.  Curve the floss around each tooth into a U-shape and gently slide it under the gum line.  Move the floss firmly up and down several times to scrape off the plaque.  After you've finished flossing, throw away the used floss and begin brushing:  Wet the brush and apply toothpaste.  Place the brush at a 45-degree angle where the teeth meet the gums.  Press firmly, and move the brush in small circles over the surface of the teeth.  Be careful not to brush too hard. Vigorous brushing can make the gums pull away from the teeth and can scratch the tooth enamel.  Brush all surfaces of the teeth, on the tongue side and on the cheek side. Pay special attention to the front teeth and all surfaces of the back teeth.  Brush chewing surfaces with short back-and-forth strokes.  After you've finished, help the person rinse the remaining toothpaste from their mouth.  Where can you learn more?  Go to https://www.bennett.info/ and enter F944 to learn more about "[place] for Older Adults."  Current as of: November 14, 2022Content Version: 13.7   2006-2023 Healthwise, Incorporated.   Care instructions adapted under license by [facility]. If you have questions about a medical condition or this instruction, always ask your healthcare professional. [place] any warranty or liability for your use of this information.           A Healthy Heart: Care Instructions  Your Care Instructions     Coronary artery disease, also called heart disease, occurs when a substance called plaque builds up in the vessels that supply oxygen-rich blood to your heart muscle. This can narrow the blood vessels and reduce blood flow. A heart attack happens when blood flow is completely blocked. A high-fat diet, smoking, and other factors increase the risk of heart disease.  Your doctor has found that you have a chance of having heart disease. You can do lots of things to keep your heart healthy. It may not be easy, but you can change your diet, exercise more, and quit smoking. These steps really work to lower your chance of heart disease.  Follow-up care is a key part of your treatment and safety. Be sure to make and go to all appointments, and call your doctor if you are having problems. It's also a good idea to know your test results  and keep a list of the medicines you take.  How can you care for yourself at home?  Diet   Use less salt when you cook and eat. This helps lower your blood pressure. Taste food before salting. Add only a little salt when you think you need it. With time, your taste buds will adjust to less salt.    Eat fewer snack items, fast foods, canned soups, and other high-salt, high-fat, processed foods.    Read food labels and try to avoid saturated and trans fats. They increase your risk of heart disease by raising cholesterol levels.    Limit the amount of solid fat-butter, margarine, and shortening-you eat. Use olive, peanut, or canola oil when you cook. Bake, broil, and steam foods instead of frying them.    Eat a variety  of fruit and vegetables every day. Dark green, deep orange, red, or yellow fruits and vegetables are especially good for you. Examples include spinach, carrots, peaches, and berries.    Foods high in fiber can reduce your cholesterol and provide important vitamins and minerals. High-fiber foods include whole-grain cereals and breads, oatmeal, beans, brown rice, citrus fruits, and apples.    Eat lean proteins. Heart-healthy proteins include seafood, lean meats and poultry, eggs, beans, peas, nuts, seeds, and soy products.    Limit drinks and foods with added sugar. These include candy, desserts, and soda pop.   Lifestyle changes   If your doctor recommends it, get more exercise. Walking is a good choice. Bit by bit, increase the amount you walk every day. Try for at least 30 minutes on most days of the week. You also may want to swim, bike, or do other activities.    Do not smoke. If you need help quitting, talk to your doctor about stop-smoking programs and medicines. These can increase your chances of quitting for good. Quitting smoking may be the most important step you can take to protect your heart. It is never too late to quit.    Limit alcohol to 2 drinks a day for men and 1 drink a day  for women. Too much alcohol can cause health problems.    Manage other health problems such as diabetes, high blood pressure, and high cholesterol. If you think you may have a problem with alcohol or drug use, talk to your doctor.   Medicines   Take your medicines exactly as prescribed. Call your doctor if you think you are having a problem with your medicine.    If your doctor recommends aspirin, take the amount directed each day. Make sure you take aspirin and not another kind of pain reliever, such as acetaminophen (Tylenol).   When should you call for help?   Call 911 if you have symptoms of a heart attack. These may include:   Chest pain or pressure, or a strange feeling in the chest.    Sweating.    Shortness of breath.    Pain, pressure, or a strange feeling in the back, neck, jaw, or upper belly or in one or both shoulders or arms.    Lightheadedness or sudden weakness.    A fast or irregular heartbeat.   After you call 911, the operator may tell you to chew 1 adult-strength or 2 to 4 low-dose aspirin. Wait for an ambulance. Do not try to drive yourself.  Watch closely for changes in your health, and be sure to contact your doctor if you have any problems.  Where can you learn more?  Go to [employer] and enter F075 to learn more about "A Healthy Heart: Care Instructions."  Current as of: September 7, 2022Content Version: 13.7   2006-2023 Healthwise, Incorporated.   Care instructions adapted under license by [facility]. If you have questions about a medical condition or this instruction, always ask your healthcare professional. Healthwise, Incorporated disclaims any warranty or liability for your use of this information.      Personalized Preventive Plan for Melanie Sandoval - 01/25/2022  Medicare offers a range of preventive health benefits. Some of the tests and screenings are paid in full while other may be subject to a deductible, co-insurance, and/or  copay.    Some of these benefits include a comprehensive review of your medical history including lifestyle, illnesses that may run in your family, and various  assessments and screenings as appropriate.    After reviewing your medical record and screening and assessments performed today your provider may have ordered immunizations, labs, imaging, and/or referrals for you.  A list of these orders (if applicable) as well as your Preventive Care list are included within your After Visit Summary for your review.    Other Preventive Recommendations:    A preventive eye exam performed by an eye specialist is recommended every 1-2 years to screen for glaucoma; cataracts, macular degeneration, and other eye disorders.  A preventive dental visit is recommended every 6 months.  Try to get at least 150 minutes of exercise per week or 10,000 steps per day on a pedometer .  Order or download the FREE "Exercise & Physical Activity: Your Everyday Guide" from [employer]. Call (270) [phone] or search [employer] online.  You need 1200-1500 mg of calcium and 1000-2000 IU of vitamin D per day. It is possible to meet your calcium requirement with diet alone, but a vitamin D supplement is usually necessary to meet this goal.  When exposed to the sun, use a sunscreen that protects against both UVA and UVB radiation with an SPF of 30 or greater. Reapply every 2 to 3 hours or after sweating, drying off with a towel, or swimming.  Always wear a seat belt when traveling in a car. Always wear a helmet when riding a bicycle or motorcycle.

## 2022-01-25 NOTE — Progress Notes (Unsigned)
Medicare Annual Wellness Visit    Melanie Sandoval is here for Medicare AWV (AWV, no complaints)    Assessment & Plan   Initial Medicare annual wellness visit  Interstitial lung disease (HCC)  -     External Referral To Pulmonology  Chronic respiratory failure with hypoxia (HCC)  Hyperglycemia  -     AMB POC HEMOGLOBIN A1C; Future  Current chronic use of systemic steroids  -     AMB POC HEMOGLOBIN A1C; Future  Hypothyroidism, unspecified type  Mixed hyperlipidemia  Primary insomnia  -     traZODone (DESYREL) 100 MG tablet; Take 1 tablet by mouth nightly as needed for Sleep, Disp-90 tablet, R-0Normal  Moderate episode of recurrent major depressive disorder (HCC)  -     traZODone (DESYREL) 100 MG tablet; Take 1 tablet by mouth nightly as needed for Sleep, Disp-90 tablet, R-0Normal  Screening for colon cancer  -     Cologuard (Fecal DNA Colorectal Cancer Screening)  Screening for colorectal cancer  -     Cologuard (Fecal DNA Colorectal Cancer Screening)    Recommendations for Preventive Services Due: see orders and patient instructions/AVS.  Recommended screening schedule for the next 5-10 years is provided to the patient in written form: see Patient Instructions/AVS.     Return in 1 year (on 01/26/2023).     Subjective   The following acute and/or chronic problems were also addressed today:  Pulm Fibrosis with acute exacerbation- She has not been able to wean off steroids. They are still seeing Dr Charlie Pitter. They have discussed using pulmonary fibrosis meds- but they were worried about liver dysfunction as she had prior cirrhosis. She went to [facility] and they are awaiting results finally from them. She feels today she is in an acute exacerbation in the last week or so and still does not feel well. She has continued Pulmonary Rehab [place]. She is taking her zithromax as well but says it upsets her stomach. She would like to consider 2nd opinoin as her dtr is considering as well. She feels that her symptoms are  worsened in the mornings.   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 She also had been having trouble sleeping and staying asleep. They had discussed trazodone with Dr Charlie Pitter- however, he did want to try Ambien. She did start Trazodone 50mg  QHS. That helped a little but she is not taking it consistently every night either. She really misses working at .   Hyperglycemia- Needs recheck of Hgba1c, pt on chronic steroids.   Hypothyroidism- TSH stable, cont on synthroid  HPL- stable on statin.   6. Squamous Cell CA- noted on lower extremity.   7. HTN- lower Bps so stopped her BP med.       Pap smear- noted always normal. Does not want to continue.   Mammogram- April normal  Colonoscopy- just saw GI. Has been a long time- cologuard was orderd but said they were verifying insurance.   Patient's complete Health Risk Assessment and screening values have been reviewed and are found in Flowsheets. The following problems were reviewed today and where indicated follow up appointments were made and/or referrals ordered.    No Positive Risk Factors identified today.    {OPTIONAL- LDCT, CVD, STI Counseling Statements:[phone]}                  Dentist Screen:  Have you seen the dentist within the past year?: (!) No  Intervention:  Advised to schedule with their dentist                    Objective   Vitals:    01/25/22 1407   BP: 98/68   Site: Left Upper Arm   Position: Sitting   Pulse: 88   Temp: 98.2 F (36.8 C)   SpO2: 90%   Weight: 150 lb 5 oz (68.2 kg)   Height: 5\' 2"  (1.575 m)      Body mass index is 27.49 kg/m.            Lipoma on upper left back, on 4L.       No Known Allergies  Prior to Visit Medications    Medication Sig Taking? Authorizing Provider   azithromycin (ZITHROMAX) 250 MG tablet Take 1 tablet by mouth three times a week Yes Historical Provider, MD   traZODone (DESYREL) 100 MG tablet Take 1 tablet by mouth nightly as needed for Sleep Yes , MD   montelukast (SINGULAIR) 10 MG tablet Take 1 tablet by mouth nightly Yes Derek Mound, MD   atorvastatin (LIPITOR) 10 MG tablet Take 1 tablet by mouth daily Yes Derek Mound, MD   hydrOXYzine HCl (ATARAX) 25 MG tablet Take 1 tablet by mouth every 8 hours as needed for Itching Yes Jasmine C Washington, APRN - NP   BREZTRI AEROSPHERE 160-9-4.8 MCG/ACT AERO INHALE 2 PUFFS TWICE DAILY FOR 90 DAYS Yes Historical Provider, MD   predniSONE (DELTASONE) 10 MG tablet TAKE 4 TABLETS BY MOUTH ONCE DAILY FOR 3 DAYS, 3 ONCE DAILY FOR 3 DAYS, 2 ONCE DAILY FOR 3 DAYS, 1 ONCE DAILY FOR 3 DAYS, 1/2 (ONE-HALF) FOR 3 DAYS FOR A TOTAL OF 15 DAYS. Yes Historical Provider, MD   diclofenac sodium (VOLTAREN) 1 % GEL Apply 2 g topically 4 times daily as needed for Pain Yes Jasmine C Washington, APRN - NP   levothyroxine (SYNTHROID) 88 MCG tablet 1 tablet daily by mouth Yes Derek Mound, MD   ipratropium-albuterol (DUONEB) 0.5-2.5 (3) MG/3ML SOLN nebulizer solution USE 3 ML IN NEBULIZER EVERY 6 HOURS FOR 30 DAYS Yes Historical Provider, MD   albuterol sulfate HFA (PROVENTIL;VENTOLIN;PROAIR) 108 (90 Base) MCG/ACT inhaler 1 puff as needed Inhalation every 4 hrs Yes Historical Provider, MD   magnesium oxide (MAG-OX) 400 MG tablet Take 1 tablet by mouth daily Yes Historical Provider, MD   Cholecalciferol (VITAMIN D) 50 MCG (2000 UT) CAPS capsule Take 2,000 Units by mouth daily Yes Historical Provider, MD   omeprazole (PRILOSEC OTC) 20 MG tablet Take 1 tablet by mouth daily Yes Historical Provider, MD   SPIRIVA RESPIMAT 2.5 MCG/ACT AERS inhaler   Historical Provider, MD   fluticasone-salmeterol (ADVAIR) 250-50 MCG/ACT AEPB diskus inhaler Inhale into the lungs 2 times daily  Patient not taking: Reported on 01/25/2022  Historical Provider, MD   losartan (COZAAR) 50 MG tablet Take 50 mg by mouth daily  Patient not taking: Reported on 01/25/2022  Historical Provider, MD       CareTeam (Including  outside providers/suppliers regularly involved in providing care):   Patient Care Team:  01/27/2022, MD as PCP - General (Family Medicine)  Derek Mound, MD as PCP - Empaneled Provider  Derek Mound, DO as Consulting Physician (Pulmonology)  Cephus Slater, MD as Consulting Physician (Internal Medicine Cardiovascular Disease)  Allie Bossier, MD as Consulting Physician (Pulmonary Disease)     Reviewed and updated this  visit:  Tobacco  Allergies  Meds  Med Hx  Surg Hx  Soc Hx  Fam Hx

## 2022-02-03 ENCOUNTER — Inpatient Hospital Stay: Admit: 2022-02-03 | Payer: MEDICARE | Primary: Family Medicine

## 2022-02-03 DIAGNOSIS — R109 Unspecified abdominal pain: Secondary | ICD-10-CM

## 2022-02-09 NOTE — Telephone Encounter (Signed)
Formatting of this note might be different from the original.  New ILD referral reviewed by ILD provider Dr. Gwendalyn Ege.  Schedule as New ILD with first available ILD provider with PFT (60 min), 6 MWT, (orders placed).  Will request previous chest CT scans from [facility] and call Ms. Barnette to ask if there are any more recent chest CT scans.    Will follow up.    02/14/2022 at 11:09 called [facility] Radiology File Room, Phone:  [phone].  Chest CT scans 05/14/2021 and 02/16/2021 will be pushed via [employer].  Will monitor and nominate to PACS on arrival.      Attempted to call Ms. Bickley to ask if she has had a more recent chest CT scan.  No answer.  Busy signal, unable to leave message.    02/15/2022 at 16:47  Received images from [facility]:  chest CT scans 02/16/2021, 11/18/2020, 05/14/2021, and chest x-ray 05/14/2021.    Placed order for chest CT scan and scheduled for [facility] 13:00 prior to provider visit.  Attempted to call Ms. Mehlberg.  No answer.  Busy signal, unable to leave message.      Called Ms. Sperling daughter, Cordelia Pen,  to review appointments and provide contact information.  Cordelia Pen was very frustrated with arranging appointments and attempting to contact [facility].  Provided my contact information, reviewed visits - date, time, location, addresses. Cordelia Pen verbalized understanding and agreement.  Electronically signed by Carilyn Goodpasture, RN at 02/15/2022  5:20 PM EDT

## 2022-02-15 LAB — FECAL DNA COLORECTAL CANCER SCREENING (COLOGUARD): FIT-DNA (Cologuard): NEGATIVE

## 2022-03-29 NOTE — Telephone Encounter (Signed)
Patient is requesting refill -     What pharmacy would you like this sent to? [facility] [phone]  Have there been any medication changes since your last visit? No   "Do you have your prescription bottle with you to read out the information to me?"    Last visit date with provider: 01/25/22  Next visit date with provider:05/22/22  Medication: hydroxyzine   Dosage:25 mg  Frequency ("how often do you take it?"): 1x a day   Quantity:  90 day supply       Office use - Is this a controlled?  If yes, was SCRIPTS checked:

## 2022-03-31 MED ORDER — HYDROXYZINE HCL 25 MG PO TABS
25 MG | ORAL_TABLET | ORAL | 0 refills | Status: AC
Start: 2022-03-31 — End: 2022-06-18

## 2022-05-16 ENCOUNTER — Encounter

## 2022-05-17 ENCOUNTER — Encounter

## 2022-05-17 MED ORDER — TRAZODONE HCL 100 MG PO TABS
100 MG | ORAL_TABLET | Freq: Every evening | ORAL | 0 refills | Status: AC | PRN
Start: 2022-05-17 — End: 2022-06-18

## 2022-05-22 ENCOUNTER — Encounter: Payer: MEDICARE | Attending: Family Medicine | Primary: Family Medicine

## 2022-05-28 LAB — HEPATIC FUNCTION PANEL
ALT: 22 U/L (ref 0–35)
AST: 21 U/L (ref 0–35)
Albumin: 4.3 g/dL (ref 3.5–5.2)
Alk Phosphatase: 64 U/L (ref 35–117)
Bilirubin, Direct: <0.2 mg/dL (ref 0.00–0.30)
Total Bilirubin: 0.52 mg/dL (ref 0.00–1.20)
Total Protein: 6.5 g/dL (ref 6.4–8.3)

## 2022-06-18 ENCOUNTER — Ambulatory Visit: Admit: 2022-06-18 | Discharge: 2022-06-18 | Payer: MEDICARE | Attending: Family Medicine | Primary: Family Medicine

## 2022-06-18 DIAGNOSIS — J849 Interstitial pulmonary disease, unspecified: Secondary | ICD-10-CM

## 2022-06-18 MED ORDER — MONTELUKAST SODIUM 10 MG PO TABS
10 MG | ORAL_TABLET | Freq: Every evening | ORAL | 3 refills | Status: AC
Start: 2022-06-18 — End: 2023-06-13

## 2022-06-18 MED ORDER — TRAZODONE HCL 100 MG PO TABS
100 MG | ORAL_TABLET | Freq: Every evening | ORAL | 3 refills | Status: AC | PRN
Start: 2022-06-18 — End: ?

## 2022-06-18 MED ORDER — HYDROXYZINE HCL 25 MG PO TABS
25 MG | ORAL_TABLET | ORAL | 0 refills | Status: DC
Start: 2022-06-18 — End: 2022-10-24

## 2022-06-18 MED ORDER — ATORVASTATIN CALCIUM 10 MG PO TABS
10 MG | ORAL_TABLET | Freq: Every day | ORAL | 3 refills | Status: AC
Start: 2022-06-18 — End: ?

## 2022-06-18 MED ORDER — LEVOTHYROXINE SODIUM 88 MCG PO TABS
88 MCG | ORAL_TABLET | ORAL | 3 refills | Status: AC
Start: 2022-06-18 — End: ?

## 2022-06-25 ENCOUNTER — Encounter

## 2022-07-24 ENCOUNTER — Encounter

## 2022-07-24 ENCOUNTER — Ambulatory Visit: Admit: 2022-07-24 | Discharge: 2022-07-24 | Payer: MEDICARE | Attending: Family Medicine | Primary: Family Medicine

## 2022-07-24 DIAGNOSIS — J849 Interstitial pulmonary disease, unspecified: Secondary | ICD-10-CM

## 2022-07-24 LAB — HEPATIC FUNCTION PANEL
ALT: 13 U/L (ref 0–35)
AST: 23 U/L (ref 0–35)
Albumin: 4.2 g/dL (ref 3.5–5.2)
Alk Phosphatase: 68 U/L (ref 35–117)
Bilirubin, Direct: 0.2 mg/dL (ref 0.00–0.30)
Bilirubin, Indirect: 0.39 mg/dL (ref 0.00–1.00)
Total Bilirubin: 0.59 mg/dL (ref 0.00–1.20)
Total Protein: 6.5 g/dL (ref 6.4–8.3)

## 2022-07-24 LAB — LIPID PANEL
Chol/HDL Ratio: 3.2 (ref 0.0–4.4)
Cholesterol: 154 mg/dL (ref 100–200)
HDL: 48 mg/dL — ABNORMAL LOW (ref >=50)
LDL Cholesterol: 83.2 mg/dL (ref 0.0–100.0)
LDL/HDL Ratio: 1.7
Triglycerides: 114 mg/dL (ref 0–149)
VLDL: 22.8 mg/dL (ref 5.0–40.0)

## 2022-07-24 LAB — AMB POC URINALYSIS DIP STICK AUTO W/O MICRO
Blood (UA POC): NEGATIVE
Glucose, Urine, POC: NEGATIVE
Nitrite, Urine, POC: NEGATIVE
Protein, Urine, POC: 30
Specific Gravity, Urine, POC: 1.02 (ref 1.001–1.035)
Urobilinogen, POC: 0.2
pH, Urine, POC: 6 (ref 4.6–8.0)

## 2022-07-24 LAB — CBC WITH AUTO DIFFERENTIAL
Absolute Baso #: 0.1 10*3/uL (ref 0.0–0.2)
Absolute Eos #: 0.2 10*3/uL (ref 0.0–0.5)
Absolute Lymph #: 1.5 10*3/uL (ref 1.0–3.2)
Absolute Mono #: 0.5 10*3/uL (ref 0.3–1.0)
Basophils %: 0.9 % (ref 0.0–2.0)
Eosinophils %: 3.2 % (ref 0.0–7.0)
Hematocrit: 39.6 % (ref 34.0–47.0)
Hemoglobin: 14.1 g/dL (ref 11.5–15.7)
Immature Grans (Abs): 0.02 10*3/uL (ref 0.00–0.06)
Immature Granulocytes: 0.3 % (ref 0.0–0.6)
Lymphocytes: 22.2 % (ref 15.0–45.0)
MCH: 31.8 pg (ref 27.0–34.5)
MCHC: 35.6 g/dL (ref 32.0–36.0)
MCV: 89.4 fL (ref 81.0–99.0)
MPV: 10.3 fL (ref 7.2–13.2)
Monocytes: 7.8 % (ref 4.0–12.0)
NRBC Absolute: 0 10*3/uL (ref 0.000–0.012)
NRBC Automated: 0 % (ref 0.0–0.2)
Neutrophils %: 65.6 % (ref 42.0–74.0)
Neutrophils Absolute: 4.4 10*3/uL (ref 1.6–7.3)
Platelets: 185 10*3/uL (ref 140–440)
RBC: 4.43 x10e6/mcL (ref 3.60–5.20)
RDW: 12.3 % (ref 11.0–16.0)
WBC: 6.7 10*3/uL (ref 3.8–10.6)

## 2022-07-24 LAB — COMPREHENSIVE METABOLIC PANEL
ALT: 13 U/L (ref 0–35)
AST: 22 U/L (ref 0–35)
Albumin/Globulin Ratio: 1.8 (ref 1.00–2.70)
Albumin: 4.2 g/dL (ref 3.5–5.2)
Alk Phosphatase: 68 U/L (ref 35–117)
Anion Gap: 12 mmol/L (ref 2–17)
BUN: 21 mg/dL (ref 8–23)
CO2: 28 mmol/L (ref 22–29)
Calcium: 10.3 mg/dL — ABNORMAL HIGH (ref 8.8–10.2)
Chloride: 103 mmol/L (ref 98–107)
Creatinine: 1 mg/dL (ref 0.5–1.0)
Est, Glom Filt Rate: 62 mL/min/1.73m (ref >=60)
Globulin: 2.4 g/dL (ref 1.9–4.4)
Glucose: 80 mg/dL (ref 70–99)
OSMOLALITY CALCULATED: 287 mOsm/kg (ref 270–287)
Potassium: 4.2 mmol/L (ref 3.5–5.3)
Sodium: 143 mmol/L (ref 135–145)
Total Bilirubin: 0.6 mg/dL (ref 0.00–1.20)
Total Protein: 6.6 g/dL (ref 6.4–8.3)

## 2022-07-24 LAB — AMB POC COVID-19 COV: SARS-COV-2 RNA, POC: NEGATIVE

## 2022-07-24 LAB — HEMOGLOBIN A1C
Est. Avg. Glucose, WB: 111
Est. Avg. Glucose-calculated: 118
Hemoglobin A1C: 5.5 % (ref 4.0–6.0)

## 2022-07-24 LAB — TSH WITH REFLEX: TSH: 2.01 mcIU/mL (ref 0.358–3.740)

## 2022-07-24 LAB — MAGNESIUM: Magnesium: 1.2 mg/dL — ABNORMAL LOW (ref 1.6–2.6)

## 2022-07-24 MED ORDER — AMOXICILLIN-POT CLAVULANATE 875-125 MG PO TABS
875-125 MG | ORAL_TABLET | Freq: Two times a day (BID) | ORAL | 0 refills | Status: AC
Start: 2022-07-24 — End: 2022-07-31

## 2022-07-24 NOTE — Progress Notes (Unsigned)
CHIEF COMPLAINT:  Chief Complaint   Patient presents with    Fatigue     Lower extremity edma- 5+ days         HISTORY OF PRESENT ILLNESS:  Ms. Kirschman is a 67 y.o. female  who presents in follow-up.   She has been having headache and dizzy when she tries to walk. She notes that she is not able to walk long distances due to this. She is actually on a wheelchair. She does feel headache in her forehead and some pressure under her R eye. She has had more headaches since starting OFEV. The dizziness is more of a weak feeling in her whole body not so much dizziness/room spinning.   Her dtr is here today- since christmas she has not felt well. She notes that last week they went to her house, she has been having body aches, dizziness headache. She has felt "cold" and fatigued to the point she does not want to move. She coughs really bad in the morning but notes not as bad throughout the day. She really does not get out much at all. She feels this fatigue is more than what is normal depression for her. Blood pressure ok, dtr who is a nurse has checked orthostatic VS and have been ok. The headache has been bothering her most often after coughing severely  She is on OFEV as well and she is not sure about any of these things being side effects on this medication.   She has also had some lower ankle swelling lately in the last 5-6 days. Her ECHO is scheduled at the end of the month/     On my exam she is 89% with pulse 80 on her pulse delivery of 4L. She does take azithromycin three times per week.   PHQ:      06/18/2022     1:20 PM   PHQ-9    Little interest or pleasure in doing things 0   Feeling down, depressed, or hopeless 0   PHQ-2 Score 0   PHQ-9 Total Score 0       CURRENT MEDICATION LIST:    Current Outpatient Medications   Medication Sig Dispense Refill    levothyroxine (SYNTHROID) 88 MCG tablet 1 tablet daily by mouth 90 tablet 3    montelukast (SINGULAIR) 10 MG tablet Take 1 tablet by mouth nightly 90 tablet 3     atorvastatin (LIPITOR) 10 MG tablet Take 1 tablet by mouth daily 90 tablet 3    hydrOXYzine HCl (ATARAX) 25 MG tablet Take 1 tablet by mouth twice daily as needed for itching or anxiety 180 tablet 0    traZODone (DESYREL) 100 MG tablet Take 1 tablet by mouth nightly as needed for Sleep 90 tablet 3    OFEV 150 MG CAPS Take 1 tablet by mouth in the morning and at bedtime      azithromycin (ZITHROMAX) 250 MG tablet Take 1 tablet by mouth three times a week      BREZTRI AEROSPHERE 160-9-4.8 MCG/ACT AERO INHALE 2 PUFFS TWICE DAILY FOR 90 DAYS      predniSONE (DELTASONE) 10 MG tablet TAKE 4 TABLETS BY MOUTH ONCE DAILY FOR 3 DAYS, 3 ONCE DAILY FOR 3 DAYS, 2 ONCE DAILY FOR 3 DAYS, 1 ONCE DAILY FOR 3 DAYS, 1/2 (ONE-HALF) FOR 3 DAYS FOR A TOTAL OF 15 DAYS.      diclofenac sodium (VOLTAREN) 1 % GEL Apply 2 g topically 4 times daily as needed for Pain  100 g 3    ipratropium-albuterol (DUONEB) 0.5-2.5 (3) MG/3ML SOLN nebulizer solution USE 3 ML IN NEBULIZER EVERY 6 HOURS FOR 30 DAYS      albuterol sulfate HFA (PROVENTIL;VENTOLIN;PROAIR) 108 (90 Base) MCG/ACT inhaler 1 puff as needed Inhalation every 4 hrs      magnesium oxide (MAG-OX) 400 MG tablet Take 1 tablet by mouth daily      Cholecalciferol (VITAMIN D) 50 MCG (2000 UT) CAPS capsule Take 2,000 Units by mouth daily      omeprazole (PRILOSEC OTC) 20 MG tablet Take 1 tablet by mouth daily       No current facility-administered medications for this visit.        ALLERGIES:    No Known Allergies     HISTORY:  Past Medical History:   Diagnosis Date    Asthma     COPD (chronic obstructive pulmonary disease) (HCC)     Hyperlipidemia     Hypertension     Hypothyroidism     Pulmonary fibrosis (HCC)       Past Surgical History:   Procedure Laterality Date    ELBOW SURGERY Left     KNEE ARTHROSCOPY Right     THYROID SURGERY        Social History     Socioeconomic History    Marital status: Single     Spouse name: Not on file    Number of children: Not on file    Years of education:  Not on file    Highest education level: Not on file   Occupational History    Not on file   Tobacco Use    Smoking status: Former     Current packs/day: 1.00     Average packs/day: 1 pack/day for 50.0 years (50.0 ttl pk-yrs)     Types: Cigarettes    Smokeless tobacco: Never   Vaping Use    Vaping Use: Never used   Substance and Sexual Activity    Alcohol use: Never    Drug use: Never    Sexual activity: Not on file   Other Topics Concern    Not on file   Social History Narrative    Not on file     Social Determinants of Health     Financial Resource Strain: Not on file   Food Insecurity: Not on file   Transportation Needs: Not on file   Physical Activity: Insufficiently Active (01/24/2022)    Exercise Vital Sign     Days of Exercise per Week: 2 days     Minutes of Exercise per Session: 60 min   Stress: Not on file   Social Connections: Not on file   Intimate Partner Violence: Not on file   Housing Stability: Not on file      Family History   Problem Relation Age of Onset    Heart Attack Father     Breast Cancer Maternal Aunt         Review of Systems     PHYSICAL EXAM:  Vital Signs -   Visit Vitals  BP 126/78   Pulse 86   Resp 16   Ht 1.575 m (5\' 2" )   Wt 70.8 kg (156 lb)   SpO2 (!) 87%   BMI 28.53 kg/m        Physical Exam   Wheeze/[pop RLL  Right maxillary sinus TTp  LABS  No results found for this visit on 07/24/22.  Orders Only on 05/28/2022   Component  Date Value Ref Range Status    Total Protein 05/28/2022 6.5  6.4 - 8.3 g/dL Final    Albumin 16/04/9603 4.3  3.5 - 5.2 g/dL Final    Total Bilirubin 05/28/2022 0.52  0.00 - 1.20 mg/dL Final    Alk Phosphatase 05/28/2022 64  35 - 117 unit/L Final    Bilirubin, Direct 05/28/2022 <0.20  0.00 - 0.30 mg/dL Final    Comment: Bilirubin Indirect unable to be calculated because Bilirubin Direct is less  than 0.2 mg/dL.      AST 05/28/2022 21  0 - 35 unit/L Final    ALT 05/28/2022 22  0 - 35 unit/L Final    Bilirubin, Indirect 05/28/2022 NOTE  0.00 - 1.00 mg/dL Final     Unable to calculate Bili Ind Calc       IMPRESSION/PLAN    There are no diagnoses linked to this encounter.     Advised ER precautions- noted that explained to her and her dtr- if she has trouble keeping O2 above 90% on her regulary home 3L O2- then must go to ER or any other concerning symptoms.   Follow up and Dispositions:  No follow-ups on file.       Derek Mound, MD

## 2022-07-25 ENCOUNTER — Encounter

## 2022-07-25 ENCOUNTER — Ambulatory Visit: Admit: 2022-07-25 | Discharge: 2022-07-25 | Payer: MEDICARE | Primary: Family Medicine

## 2022-07-25 DIAGNOSIS — R051 Acute cough: Secondary | ICD-10-CM

## 2022-07-25 LAB — CULTURE, URINE: FINAL REPORT: NO GROWTH

## 2022-07-25 NOTE — Other (Signed)
Magnesium low-are you taking supplementation daily?    Kidney functions normal     Cholesterol stable     Blood lines normal     Thyroid normal     A1C normal

## 2022-07-26 MED ORDER — PREDNISONE 10 MG PO TABS
10 MG | ORAL_TABLET | ORAL | 0 refills | Status: AC
Start: 2022-07-26 — End: 2022-08-10

## 2022-07-26 MED ORDER — MAGNESIUM 400 MG PO TABS
400 MG | ORAL_TABLET | Freq: Two times a day (BID) | ORAL | 0 refills | Status: AC
Start: 2022-07-26 — End: 2022-08-18

## 2022-07-27 LAB — BRAIN NATRIURETIC PEPTIDE: NT Pro-BNP: 1251 pg/mL — ABNORMAL HIGH (ref 0–125)

## 2022-07-27 NOTE — Progress Notes (Signed)
Request sent

## 2022-07-30 ENCOUNTER — Encounter

## 2022-08-02 ENCOUNTER — Ambulatory Visit: Admit: 2022-08-02 | Discharge: 2022-08-02 | Payer: MEDICARE | Attending: Foot Surgery | Primary: Family Medicine

## 2022-08-02 DIAGNOSIS — B351 Tinea unguium: Secondary | ICD-10-CM

## 2022-08-02 MED ORDER — TAVABOROLE 5 % EX SOLN
5 % | Freq: Every day | CUTANEOUS | 3 refills | Status: AC
Start: 2022-08-02 — End: 2022-09-01

## 2022-08-02 NOTE — Progress Notes (Signed)
08/02/22   Melanie Sandoval  [birth year]  67 y.o.    SUBJECTIVE     Chief Complaint   Patient presents with    Foot Pain     NON HEALING TOE FUNGUS        HPI   The patient is concerned about thick dystrophic and tender right great toenail that has been present for several years.  She states part of the toenail recently came off.  She has tried all types of topical antifungal nail medications with little to no relief and is seeking something that may help.  She states it is tender.    ROS   Review of Systems   Constitutional: Negative.    HENT: Negative.     Respiratory: Negative.     Cardiovascular: Negative.    Gastrointestinal: Negative.         MEDICAL HISTORY   Past Medical History:   Diagnosis Date    Asthma     COPD (chronic obstructive pulmonary disease) (HCC)     Hyperlipidemia     Hypertension     Hypothyroidism     Pulmonary fibrosis ([place])       Social History     Socioeconomic History    Marital status: Single     Spouse name: Not on file    Number of children: Not on file    Years of education: Not on file    Highest education level: Not on file   Occupational History    Not on file   Tobacco Use    Smoking status: Former     Current packs/day: 1.00     Average packs/day: 1 pack/day for 50.0 years (50.0 ttl pk-yrs)     Types: Cigarettes    Smokeless tobacco: Never   Vaping Use    Vaping Use: Never used   Substance and Sexual Activity    Alcohol use: Never    Drug use: Never    Sexual activity: Not on file   Other Topics Concern    Not on file   Social History Narrative    Not on file     Social Determinants of Health     Financial Resource Strain: Not on file   Food Insecurity: Not on file   Transportation Needs: Not on file   Physical Activity: Insufficiently Active (01/24/2022)    Exercise Vital Sign     Days of Exercise per Week: 2 days     Minutes of Exercise per Session: 60 min   Stress: Not on file   Social Connections: Not on file   Intimate Partner Violence: Not on file   Housing Stability: Not on  file      Family History   Problem Relation Age of Onset    Heart Attack Father     Breast Cancer Maternal Aunt         Outpatient Encounter Medications as of 08/02/2022   Medication Sig Dispense Refill    Tavaborole 5 % SOLN Apply 1 applicator topically daily 10 mL 3    Magnesium 400 MG TABS Take 1 tablet by mouth in the morning and at bedtime 14 tablet 0    predniSONE (DELTASONE) 10 MG tablet Take 4 tablets by mouth daily for 3 days, THEN 3 tablets daily for 3 days, THEN 2 tablets daily for 3 days, THEN 1 tablet daily for 3 days, THEN 0.5 tablets daily for 4 days. 32 tablet 0    levothyroxine (SYNTHROID) 88 MCG  tablet 1 tablet daily by mouth 90 tablet 3    montelukast (SINGULAIR) 10 MG tablet Take 1 tablet by mouth nightly 90 tablet 3    atorvastatin (LIPITOR) 10 MG tablet Take 1 tablet by mouth daily 90 tablet 3    hydrOXYzine HCl (ATARAX) 25 MG tablet Take 1 tablet by mouth twice daily as needed for itching or anxiety 180 tablet 0    traZODone (DESYREL) 100 MG tablet Take 1 tablet by mouth nightly as needed for Sleep 90 tablet 3    OFEV 150 MG CAPS Take 1 tablet by mouth in the morning and at bedtime      azithromycin (ZITHROMAX) 250 MG tablet Take 1 tablet by mouth three times a week      BREZTRI AEROSPHERE 160-9-4.8 MCG/ACT AERO INHALE 2 PUFFS TWICE DAILY FOR 90 DAYS      predniSONE (DELTASONE) 10 MG tablet TAKE 4 TABLETS BY MOUTH ONCE DAILY FOR 3 DAYS, 3 ONCE DAILY FOR 3 DAYS, 2 ONCE DAILY FOR 3 DAYS, 1 ONCE DAILY FOR 3 DAYS, 1/2 (ONE-HALF) FOR 3 DAYS FOR A TOTAL OF 15 DAYS.      diclofenac sodium (VOLTAREN) 1 % GEL Apply 2 g topically 4 times daily as needed for Pain 100 g 3    ipratropium-albuterol (DUONEB) 0.5-2.5 (3) MG/3ML SOLN nebulizer solution USE 3 ML IN NEBULIZER EVERY 6 HOURS FOR 30 DAYS      albuterol sulfate HFA (PROVENTIL;VENTOLIN;PROAIR) 108 (90 Base) MCG/ACT inhaler 1 puff as needed Inhalation every 4 hrs      magnesium oxide (MAG-OX) 400 MG tablet Take 1 tablet by mouth daily       Cholecalciferol (VITAMIN D) 50 MCG (2000 UT) CAPS capsule Take 2,000 Units by mouth daily      omeprazole (PRILOSEC OTC) 20 MG tablet Take 1 tablet by mouth daily       No facility-administered encounter medications on file as of 08/02/2022.      No Known Allergies   Ht 1.575 m (5' 2.01")   Wt 70.8 kg (156 lb)   BMI 28.53 kg/m     OBJECTIVE      EXAMINATION    OBJECTIVE  VASCULAR: DP and PT pulses are palpable 2/4 b/l. CFT is brisk to the digits of the foot b/l. Skin temperature is warm to cool from proximal to distal with no focal calor noted. No edema noted.     NEUROLOGIC: Gross and epicritic sensation is intact b/l. Protective sensation is appreciated at all pedal sites b/l.    DERMATOLOGIC: Skin is of normal texture and turgor of both feet.  He has evidence of onychomycosis of the right great toenail with some periungual inflammation tenderness on palpation.  The nail is dystrophic and brittle with subungual debris and discoloration consistent with onychomycosis.  Clubbing is noted of her toenails.    MUSCULOSKELETAL: Muscle strength is 5/5 for all pedal groups tested. No pain with palpation of the foot or ankle b/l. Ankle joint and Subtalar joint ROM is within normal limits. No obvious biomechanical abnormalities.       COGNITIVE  PATIENT IS ALERT AND ORIENTED X3      ASSESSMENT:      Diagnosis Orders   1. Onychomycosis of toenail        2. Pain of toe of right foot        3. Clubbing of nails  PLAN:   I debrided back the right great toenail.    I discussed treatment options.  I am recommending a prescription topical antifungal nail medication.  I will prescribe Kerydin.    I discussed the medication and how it is utilized.  Will likely take 6 months for this to improve and I have asked that she follow-up in 6 months, sooner as needed.      FOLLOW UP    Return in about 6 months (around 01/31/2023) for Onychomycosis/Kerydin.    Baldo Daub, DPM

## 2022-08-07 ENCOUNTER — Inpatient Hospital Stay: Admit: 2022-08-07 | Payer: MEDICARE | Attending: Pulmonary Disease | Primary: Family Medicine

## 2022-08-07 DIAGNOSIS — J441 Chronic obstructive pulmonary disease with (acute) exacerbation: Secondary | ICD-10-CM

## 2022-08-07 DIAGNOSIS — I082 Rheumatic disorders of both aortic and tricuspid valves: Secondary | ICD-10-CM

## 2022-08-07 LAB — ECHO (TTE) COMPLETE (PRN CONTRAST/BUBBLE/STRAIN/3D)
AV Area by Peak Velocity: 2.7 cm2
AV Area by VTI: 2.5 cm2
AV Mean Gradient: 4 mmHg
AV Mean Velocity: 0.9 m/s
AV Peak Gradient: 5 mmHg
AV Peak Velocity: 1.2 m/s
AV VTI: 30.3 cm
AV Velocity Ratio: 0.83
Aortic Root: 3.6 cm
Ascending Aorta: 3.4 cm
E/E' Lateral: 5.88
E/E' Ratio (Averaged): 8.81
Est. RA Pressure: 3 mmHg
Fractional Shortening 2D: 25 % (ref 28–44)
IVC Expiration: 1.8 cm
IVSd: 0.9 cm (ref 0.6–0.9)
LA Area 2C: 19.8 cm2
LA Area 4C: 15.2 cm2
LA Diameter: 4.1 cm
LA Major Axis: 5.9 cm
LA Minor Axis: 5.8 cm
LA Volume BP: 42 mL (ref 22–52)
LA Volume MOD A4C: 32 mL (ref 22–52)
LA/AO Root Ratio: 1.14
LV E' Lateral Velocity: 8 cm/s
LV E' Septal Velocity: 4 cm/s
LV EDV A4C: 76 mL
LV ESV A4C: 38 mL
LV Ejection Fraction A4C: 50 %
LV Mass 2D: 128 g (ref 67–162)
LV RWT Ratio: 0.41
LVIDd: 4.4 cm (ref 3.9–5.3)
LVIDs: 3.3 cm
LVOT Area: 3.1 cm2
LVOT Diameter: 2 cm
LVOT Mean Gradient: 2 mmHg
LVOT Peak Gradient: 4 mmHg
LVOT Peak Velocity: 1 m/s
LVOT SV: 74.1 ml
LVOT VTI: 23.6 cm
LVOT:AV VTI Index: 0.78
LVPWd: 0.9 cm (ref 0.6–0.9)
MV A Velocity: 0.66 m/s
MV E Velocity: 0.47 m/s
MV E Wave Deceleration Time: 162 ms
MV E/A: 0.71
RV Free Wall Peak S': 11 cm/s
RVIDd: 3.7 cm
TAPSE: 1.7 cm (ref 1.7–?)

## 2022-08-16 ENCOUNTER — Inpatient Hospital Stay
Admission: EM | Admit: 2022-08-16 | Discharge: 2022-08-18 | Disposition: A | Discharge status: Home / Self Care | Payer: MEDICARE | Admitting: Family Medicine

## 2022-08-16 ENCOUNTER — Emergency Department: Admit: 2022-08-17 | Payer: MEDICARE | Primary: Family Medicine

## 2022-08-16 ENCOUNTER — Emergency Department: Admit: 2022-08-16 | Payer: MEDICARE | Primary: Family Medicine

## 2022-08-16 DIAGNOSIS — R0902 Hypoxemia: Secondary | ICD-10-CM

## 2022-08-16 DIAGNOSIS — J84112 Idiopathic pulmonary fibrosis: Principal | ICD-10-CM

## 2022-08-16 LAB — CBC WITH AUTO DIFFERENTIAL
Absolute Baso #: 0 10*3/uL (ref 0.0–0.2)
Absolute Eos #: 0.5 10*3/uL (ref 0.0–0.5)
Absolute Lymph #: 2.2 10*3/uL (ref 1.0–3.2)
Absolute Mono #: 0.7 10*3/uL (ref 0.3–1.0)
Basophils %: 0.4 % (ref 0.0–2.0)
Eosinophils %: 5.2 % (ref 0.0–7.0)
Hematocrit: 40.9 % (ref 34.0–47.0)
Hemoglobin: 13.7 g/dL (ref 11.5–15.7)
Immature Grans (Abs): 0.03 10*3/uL (ref 0.00–0.06)
Immature Granulocytes: 0.3 % (ref 0.0–0.6)
Lymphocytes: 22.8 % (ref 15.0–45.0)
MCH: 30.6 pg (ref 27.0–34.5)
MCHC: 33.5 g/dL (ref 32.0–36.0)
MCV: 91.5 fL (ref 81.0–99.0)
MPV: 9.8 fL (ref 7.2–13.2)
Monocytes: 7.5 % (ref 4.0–12.0)
Neutrophils %: 63.8 % (ref 42.0–74.0)
Neutrophils Absolute: 6.2 10*3/uL (ref 1.6–7.3)
Platelets: 163 10*3/uL (ref 140–440)
RBC: 4.47 x10e6/mcL (ref 3.60–5.20)
RDW: 11.9 % (ref 11.0–16.0)
WBC: 9.8 10*3/uL (ref 3.8–10.6)

## 2022-08-16 LAB — COMPREHENSIVE METABOLIC PANEL W/ REFLEX TO MG FOR LOW K
ALT: 32 U/L (ref 0–35)
AST: 27 U/L (ref 0–35)
Albumin/Globulin Ratio: 2 (ref 1.00–2.70)
Albumin: 4.2 g/dL (ref 3.5–5.2)
Alk Phosphatase: 76 U/L (ref 35–117)
Anion Gap: 13 mmol/L (ref 2–17)
BUN: 22 mg/dL (ref 8–23)
CO2: 25 mmol/L (ref 22–29)
Calcium: 10 mg/dL (ref 8.8–10.2)
Chloride: 104 mmol/L (ref 98–107)
Creatinine: 0.9 mg/dL (ref 0.5–1.0)
Est, Glom Filt Rate: 70 mL/min/1.73m (ref >=60)
Globulin: 2 g/dL (ref 1.9–4.4)
Glucose: 94 mg/dL (ref 70–99)
OSMOLALITY CALCULATED: 286 mOsm/kg (ref 270–287)
Potassium: 3.6 mmol/L (ref 3.5–5.3)
Sodium: 142 mmol/L (ref 135–145)
Total Bilirubin: 0.47 mg/dL (ref 0.00–1.20)
Total Protein: 6.6 g/dL (ref 6.4–8.3)

## 2022-08-16 LAB — TROPONIN
Troponin, High Sensitivity: 18 ng/L — ABNORMAL HIGH (ref 0–14)
Troponin, High Sensitivity: 18 ng/L — ABNORMAL HIGH (ref 0–14)

## 2022-08-16 LAB — PROCALCITONIN: Procalcitonin: 0.06 ng/mL (ref <=0.24)

## 2022-08-16 LAB — BRAIN NATRIURETIC PEPTIDE: NT Pro-BNP: 1650 pg/mL — ABNORMAL HIGH (ref 0–125)

## 2022-08-16 MED ORDER — ALBUTEROL SULFATE (2.5 MG/3ML) 0.083% IN NEBU
Freq: Once | RESPIRATORY_TRACT | Status: AC
Start: 2022-08-16 — End: 2022-08-16
  Administered 2022-08-16: 23:00:00 5 mg via RESPIRATORY_TRACT

## 2022-08-16 MED ORDER — FUROSEMIDE 10 MG/ML IJ SOLN
10 | Freq: Once | INTRAMUSCULAR | Status: AC
Start: 2022-08-16 — End: 2022-08-16
  Administered 2022-08-16: 40 mg via INTRAVENOUS

## 2022-08-16 MED ORDER — DEXAMETHASONE SOD PHOSPHATE PF 10 MG/ML IJ SOLN
10 | Freq: Once | INTRAMUSCULAR | Status: AC
Start: 2022-08-16 — End: 2022-08-16
  Administered 2022-08-16: 23:00:00 10 mg via INTRAVENOUS

## 2022-08-16 MED FILL — ALBUTEROL SULFATE (2.5 MG/3ML) 0.083% IN NEBU: RESPIRATORY_TRACT | Qty: 6

## 2022-08-16 MED FILL — DEXAMETHASONE SOD PHOSPHATE PF 10 MG/ML IJ SOLN: 10 MG/ML | INTRAMUSCULAR | Qty: 1

## 2022-08-16 MED FILL — FUROSEMIDE 10 MG/ML IJ SOLN: 10 MG/ML | INTRAMUSCULAR | Qty: 4

## 2022-08-16 NOTE — ED Notes (Signed)
RN report to [facility] on [place], patient's uninventoried items bagged by daughter Venida Jarvis and left at bedside in room on [place]

## 2022-08-16 NOTE — ED Provider Notes (Signed)
[facility] DEPT  EMERGENCY DEPARTMENT ENCOUNTER      Pt Name: Melanie Sandoval  MRN: [id number]  [place] [birth year]  Date of evaluation: 08/16/2022  Provider: Vonzella Nipple, MD    CHIEF COMPLAINT       Chief Complaint   Patient presents with    Shortness of Breath     Increased SOB, fatigue for 3 weeks. Pt has pulm fibrosis, wears home o2 but she needed to increase to 4lt from 2.5lt recently due to SOB. Saw PCP on the 16th, being treated for pneu since then. Symptoms not getting better.       HISTORY OF PRESENT ILLNESS    HPI  67 y.o. female with a history of pulmonary fibrosis, COPD, home O2 dependent, complains of worsening shortness of breath with decreased O2 sats, especially with any exertion.  This began in earnest 3 to 4 weeks ago.  She was seen by her primary care physician who started on a course of steroids as well as empiric antibiotics for possible pneumonia seen on chest x-ray.  Daughter, who is a Marine scientist, reports they have had to increase her oxygen at home from her usual 2-1/2 L up to is much as 5 L for recent days.  Patient has been coughing but not especially more so than usual.  No known fevers or chills.  She has noted some mild increased swelling of her ankles.  No other associated symptoms.  No exacerbating/relieving factors    Nursing Notes were reviewed.    REVIEW OF SYSTEMS     Review of Systems    Except as noted above the remainder of the review of systems was reviewed and negative.     PAST MEDICAL HISTORY     Past Medical History:   Diagnosis Date    Asthma     COPD (chronic obstructive pulmonary disease) ([place])     Hyperlipidemia     Hypertension     Hypothyroidism     Pulmonary fibrosis ([place])        SURGICAL HISTORY       Past Surgical History:   Procedure Laterality Date    ELBOW SURGERY Left     KNEE ARTHROSCOPY Right     THYROID SURGERY         CURRENT MEDICATIONS       Previous Medications    ALBUTEROL SULFATE HFA (PROVENTIL;VENTOLIN;PROAIR) 108 (90 BASE) MCG/ACT INHALER    1  puff as needed Inhalation every 4 hrs    ATORVASTATIN (LIPITOR) 10 MG TABLET    Take 1 tablet by mouth daily    AZITHROMYCIN (ZITHROMAX) 250 MG TABLET    Take 1 tablet by mouth three times a week    BREZTRI AEROSPHERE 160-9-4.8 MCG/ACT AERO    INHALE 2 PUFFS TWICE DAILY FOR 90 DAYS    CHOLECALCIFEROL (VITAMIN D) 50 MCG (2000 UT) CAPS CAPSULE    Take 2,000 Units by mouth daily    DICLOFENAC SODIUM (VOLTAREN) 1 % GEL    Apply 2 g topically 4 times daily as needed for Pain    HYDROXYZINE HCL (ATARAX) 25 MG TABLET    Take 1 tablet by mouth twice daily as needed for itching or anxiety    IPRATROPIUM-ALBUTEROL (DUONEB) 0.5-2.5 (3) MG/3ML SOLN NEBULIZER SOLUTION    USE 3 ML IN NEBULIZER EVERY 6 HOURS FOR 30 DAYS    LEVOTHYROXINE (SYNTHROID) 88 MCG TABLET    1 tablet daily by mouth    MAGNESIUM 400 MG  TABS    Take 1 tablet by mouth in the morning and at bedtime    MAGNESIUM OXIDE (MAG-OX) 400 MG TABLET    Take 1 tablet by mouth daily    MONTELUKAST (SINGULAIR) 10 MG TABLET    Take 1 tablet by mouth nightly    OFEV 150 MG CAPS    Take 1 tablet by mouth in the morning and at bedtime    OMEPRAZOLE (PRILOSEC OTC) 20 MG TABLET    Take 1 tablet by mouth daily    PREDNISONE (DELTASONE) 10 MG TABLET    TAKE 4 TABLETS BY MOUTH ONCE DAILY FOR 3 DAYS, 3 ONCE DAILY FOR 3 DAYS, 2 ONCE DAILY FOR 3 DAYS, 1 ONCE DAILY FOR 3 DAYS, 1/2 (ONE-HALF) FOR 3 DAYS FOR A TOTAL OF 15 DAYS.    TAVABOROLE 5 % SOLN    Apply 1 applicator topically daily    TRAZODONE (DESYREL) 100 MG TABLET    Take 1 tablet by mouth nightly as needed for Sleep       ALLERGIES     Patient has no known allergies.    FAMILY HISTORY       Family History   Problem Relation Age of Onset    Heart Attack Father     Breast Cancer Maternal Aunt         SOCIAL HISTORY       Social History     Socioeconomic History    Marital status: Single   Tobacco Use    Smoking status: Former     Current packs/day: 1.00     Average packs/day: 1 pack/day for 50.0 years (50.0 ttl pk-yrs)     Types:  Cigarettes    Smokeless tobacco: Never   Vaping Use    Vaping Use: Never used   Substance and Sexual Activity    Alcohol use: Never    Drug use: Never     Social Determinants of Health     Physical Activity: Insufficiently Active (01/24/2022)    Exercise Vital Sign     Days of Exercise per Week: 2 days     Minutes of Exercise per Session: 60 min       SCREENINGS         Glasgow Coma Scale  Eye Opening: Spontaneous  Best Verbal Response: Oriented  Best Motor Response: Obeys commands  Glasgow Coma Scale Score: 15                     CIWA Assessment  BP: 122/88  Pulse: 81                 PHYSICAL EXAM    (up to 7 for level 4, 8 or more for level 5)     ED Triage Vitals [08/16/22 1716]   BP Temp Temp Source Pulse Respirations SpO2 Height Weight - Scale   (!) 166/105 97.7 F (36.5 C) Oral 91 21 91 % 1.575 m (5\' 2" ) 71.2 kg (157 lb)       Physical Exam  Vitals and nursing note reviewed.   Constitutional:       Appearance: Normal appearance. She is not toxic-appearing.   HENT:      Head: Normocephalic and atraumatic.      Right Ear: External ear normal.      Left Ear: External ear normal.      Nose: Nose normal.      Mouth/Throat:      Mouth: Mucous membranes  are moist.      Pharynx: Oropharynx is clear.   Eyes:      Extraocular Movements: Extraocular movements intact.      Pupils: Pupils are equal, round, and reactive to light.   Cardiovascular:      Rate and Rhythm: Normal rate and regular rhythm.      Pulses: Normal pulses.   Pulmonary:      Effort: Pulmonary effort is normal.      Breath sounds: Wheezing (Scattered, rare) and rales (Bibasilar) present.   Abdominal:      General: Bowel sounds are normal. There is no distension.      Tenderness: There is no abdominal tenderness. There is no guarding.   Musculoskeletal:         General: Normal range of motion.      Cervical back: Normal range of motion.      Comments: 1+ ankle edema   Skin:     General: Skin is warm and dry.   Neurological:      General: No focal deficit  present.      Mental Status: She is alert.   Psychiatric:         Mood and Affect: Mood normal.         Behavior: Behavior normal.         Procedures    DIAGNOSTIC RESULTS         CTA CHEST W WO CONTRAST   Final Result      No evidence of pulmonary embolus.      Stable appearance of pulmonary fibrosis - UIP pattern and emphysema. No focal    consolidation.      CT CHEST WO CONTRAST   Final Result   1. 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      2. Emphysema.      3. No acute abnormality.      XR CHEST PORTABLE   Final Result      Cardiomediastinal silhouette is unchanged. Chronic lower lobe predominant    interstitial airspace opacities appear similar in configuration to prior    examination and compatible with history of pulmonary fibrosis. Superimposed    infection would be difficult to exclude given extent. No large pleural effusion.    No pneumothorax. Otherwise, stable exam.            LABS:  Labs Reviewed   BRAIN NATRIURETIC PEPTIDE - Abnormal; Notable for the following components:       Result Value    NT Pro-BNP 1,650 (*)     All other components within normal limits   TROPONIN - Abnormal; Notable for the following components:    Troponin, High Sensitivity 18 (*)     All other components within normal limits   TROPONIN - Abnormal; Notable for the following components:    Troponin, High Sensitivity 18 (*)     All other components within normal limits   CBC WITH AUTO DIFFERENTIAL   COMPREHENSIVE METABOLIC PANEL W/ REFLEX TO MG FOR LOW K   PROCALCITONIN   COMPREHENSIVE METABOLIC PANEL W/ REFLEX TO MG FOR LOW K   CBC WITH AUTO DIFFERENTIAL       All other labs were within normal range or not returned as of this dictation.    EMERGENCY DEPARTMENT COURSE/REASSESSMENT and MDM:   Vitals:    Vitals:    08/16/22 1830 08/16/22 2000 08/16/22 2017 08/16/22 2030   BP: 137/82 Marland Kitchen)  134/99  122/88   Pulse: 79 (!) 37  81   Resp: 21   22   Temp:       TempSrc:       SpO2: 94%  93% (!) 84%    Weight:       Height:           Medical Decision Making  Amount and/or Complexity of Data Reviewed  Labs: ordered.  Radiology: ordered.  ECG/medicine tests: ordered.    Risk  Prescription drug management.  Decision regarding hospitalization.         ED Course:    ED Course as of 08/16/22 2211   Thu Aug 16, 2022   2023 Patient's labs demonstrate a minimal elevation in proBNP, somewhat consistent with recent values but improved compared to a year ago.    CBC and chemistry panel are normal.  Procalcitonin is negative.    She was given a dose of Lasix here as well as steroid and a albuterol neb [CM]   2142 I spoke with Dr. Ted Mcalpine.  He advised continuing with diuresis.  Pursue CTA chest to rule out PE but otherwise medical management treat for possible mild underlying heart failure. [CM]   2142 CTA is unremarkable beyond chronic disease.  Patient has diuresed well here.  She remains quite hypoxic, noted to drop into the high 70s with walking only a few steps, despite still being on oxygen. [CM]   2158 I spoke with Dr. Enrigue Catena of the hospitalist service who has accepted this patient for further evaluation and management [CM]      ED Course User Index  [CM] Lethea Killings Gerline Legacy, MD       Pulse oximetry is 77%, on 5 L, during exertion, which is hypoxic     for this patient by my interpretation    CONSULTS:  Hospitalist  Pulmonology    FINAL IMPRESSION      1. Dyspnea, unspecified type    2. Pulmonary fibrosis ([place])    3. Hypoxia          DISPOSITION/PLAN   DISPOSITION Admitted 08/16/2022 10:08:11 PM      PATIENT REFERRED TO:  No follow-up provider specified.    DISCHARGE MEDICATIONS:  New Prescriptions    No medications on file     Controlled Substances Monitoring:          No data to display                (Please note that portions of this note were completed with a voice recognition program.  Efforts were made to edit the dictations but occasionally words are mis-transcribed.)    Vonzella Nipple, MD (electronically signed)  Attending Emergency Physician           Vonzella Nipple, MD  08/16/22 2211

## 2022-08-16 NOTE — ED Notes (Signed)
RN report to [facility] on [place]

## 2022-08-16 NOTE — ED Notes (Signed)
Patient using commode at bedside, O2 drops to 85% on 4L nasal cannula during exertion.

## 2022-08-16 NOTE — H&P (Addendum)
[facility] Hospitalist Service     Hospitalist H & P     Name: Melanie Sandoval   DOB: 01-29-1956 (Age: 67 y.o.)   Date of Admission: 08/16/22    Primary Care Provider: Gean Birchwood, MD  Attending: Carron Curie, MD    Chief Complaint: Dyspnea      History of Present Illness  Melanie Sandoval is a 67 y.o. female with a PMHx significant for pulmonary fibrosis (on 2L NC as outpatient), emphysema, hypothyroidism -- who presented to the ED with complaints of progressive dyspnea, which the patient has been noticing over the past ~1 month. The history is supplemented by the patient's daughter at bedside, who was previously an Therapist, sports within the [employer] system. The patient and her daughter are concerned about the patient's progressive functional decline, given that she lives independently, and her activities are now limited rather significantly by dyspnea. In the past month, she has required an upward titration of O2 to 5L, especially with ambulation. She denies any significant changes in her overall status otherwise; no chest pain, pleurisy, change in cough. No fever, chills, nausea, vomiting. Patient was recently seen by Dr. Rubye Beach (pulmonology), who ordered a TTE. Attempts have been made to initiate patient on nintedanib, though patient was unable to tolerate the medication. She was recently prescribed a course of augmentin and prednisone by her PCP, but unfortunately these therapies offered no benefit to the patient, per report.     On arrival to the ER, the patient was afebrile and hemodynamically stable; saturating appropriately on 3L via NC at rest. Serum chemistries entirely unremarkable. CBC without leukocytosis or anemia. CTA chest negative for PE, though 'stable appearance of pulmonary fibrosis' noted, in the UIP pattern. No focal consolidation noted. Procalcitonin within normal limits. BNP mildly (and chronically) elevated, at 1650. Patient received 10mg  IV dexamethasone, 40mg  IV lasix, and  nebulizer therapies in the ER. Attempt made to ambulate the patient, and she reportedly desaturated into the mid 70's on O2 therapy. Admission requested for further management.      Past Medical History     Past Medical History:   Diagnosis Date    Asthma     COPD (chronic obstructive pulmonary disease) ([place])     Hyperlipidemia     Hypertension     Hypothyroidism     Pulmonary fibrosis ([place])         Past Surgical History     Past Surgical History:   Procedure Laterality Date    ELBOW SURGERY Left     KNEE ARTHROSCOPY Right     THYROID SURGERY          Allergies   Patient has no known allergies.       Medications     Current Outpatient Medications   Medication Instructions    albuterol sulfate HFA (PROVENTIL;VENTOLIN;PROAIR) 108 (90 Base) MCG/ACT inhaler 1 puff as needed Inhalation every 4 hrs    atorvastatin (LIPITOR) 10 mg, Oral, DAILY    azithromycin (ZITHROMAX) 250 mg, Oral, THREE TIMES WEEKLY (MONDAY, WEDNESDAY, FRIDAY)    BREZTRI AEROSPHERE 160-9-4.8 MCG/ACT AERO INHALE 2 PUFFS TWICE DAILY FOR 90 DAYS    diclofenac sodium (VOLTAREN) 2 g, Topical, 4 TIMES DAILY PRN    hydrOXYzine HCl (ATARAX) 25 MG tablet Take 1 tablet by mouth twice daily as needed for itching or anxiety    ipratropium-albuterol (DUONEB) 0.5-2.5 (3) MG/3ML SOLN nebulizer solution USE 3 ML IN NEBULIZER EVERY 6 HOURS FOR 30 DAYS  levothyroxine (SYNTHROID) 88 MCG tablet 1 tablet daily by mouth    Magnesium 400 MG TABS 1 tablet, Oral, 2 times daily    magnesium oxide (MAG-OX) 400 mg, Oral, DAILY    montelukast (SINGULAIR) 10 mg, Oral, NIGHTLY    OFEV 150 MG CAPS 1 tablet, Oral, 2 times daily    omeprazole (PRILOSEC OTC) 20 mg, Oral, DAILY    predniSONE (DELTASONE) 10 MG tablet TAKE 4 TABLETS BY MOUTH ONCE DAILY FOR 3 DAYS, 3 ONCE DAILY FOR 3 DAYS, 2 ONCE DAILY FOR 3 DAYS, 1 ONCE DAILY FOR 3 DAYS, 1/2 (ONE-HALF) FOR 3 DAYS FOR A TOTAL OF 15 DAYS.    Tavaborole 5 % SOLN 1 applicator, Apply externally, DAILY    traZODone (DESYREL) 100 mg, Oral,  NIGHTLY PRN    vitamin D 2,000 Units, Oral, DAILY         Family History     Family History   Problem Relation Age of Onset    Heart Attack Father     Breast Cancer Maternal Aunt         Social History      Social History     Socioeconomic History    Marital status: Single     Spouse name: Not on file    Number of children: Not on file    Years of education: Not on file    Highest education level: Not on file   Occupational History    Not on file   Tobacco Use    Smoking status: Former     Current packs/day: 1.00     Average packs/day: 1 pack/day for 50.0 years (50.0 ttl pk-yrs)     Types: Cigarettes    Smokeless tobacco: Never   Vaping Use    Vaping Use: Never used   Substance and Sexual Activity    Alcohol use: Never    Drug use: Never    Sexual activity: Not on file   Other Topics Concern    Not on file   Social History Narrative    Not on file     Social Determinants of Health     Financial Resource Strain: Not on file   Food Insecurity: Not on file   Transportation Needs: Not on file   Physical Activity: Insufficiently Active (01/24/2022)    Exercise Vital Sign     Days of Exercise per Week: 2 days     Minutes of Exercise per Session: 60 min   Stress: Not on file   Social Connections: Not on file   Intimate Partner Violence: Not on file   Housing Stability: Not on file        Review of Systems (positives bolded, otherwise negative)     10 point ROS reviewed, and negative -- unless otherwise stated in the HPI (as above), or assessment (as below)     Physical Exam     Vitals:    08/16/22 2000 08/16/22 2017 08/16/22 2030 08/16/22 2130   BP: (!) 134/99  122/88 (!) 124/94   Pulse: (!) 37  81 76   Resp:   22 23   Temp:       TempSrc:       SpO2: 93% (!) 84%  95%   Weight:       Height:           General: Awake, alert, cooperative. No acute distress  Head: Normocephalic, atraumatic   Ophthalmic: Anicteric sclera, no subconjunctival pallor   Cardiologic:  Regular rate, regular rhythm, no murmurs   Pulmonary: Clear to  auscultation bilaterally, normal respiratory effort   Gastrointestinal: Soft, non-tender, no distension. Normoactive bowel sounds  Vascular: Appropriate warmth and color of extremities. No LE edema noted  Musculoskeletal: Appropriate tone, without restriction in movements    Skin: No rashes or suspicious lesions observed; no localized erythema   Neurologic: No focal weakness, no slurred speech, no facial droop  Psychiatric: Appropriate mood and affect       Labs and Imaging     Labs:   Hematologic/Coags Chemistries   Recent Labs     08/16/22  1740   WBC 9.8   HGB 13.7   HCT 40.9   PLT 163     Lab Results   Component Value Date/Time    PROT 6.6 08/16/2022 05:40 PM     No components found for: "HGBA1C"  Lab Results   Component Value Date/Time    INR 1.0 01/11/2022 03:14 PM    PROTIME 13.2 01/11/2022 03:14 PM     Lab Results   Component Value Date/Time    APTT 31.0 01/11/2022 03:14 PM     No results found for: "DDIMER"   Recent Labs     08/16/22  1740   NA 142   K 3.6   CL 104   CO2 25   BUN 22   CREATININE 0.9   BILITOT 0.47   ALKPHOS 76   AST 27   ALT 32     No results for input(s): "GLU" in the last 72 hours.  No results found for: "CPK", "CKMB", "TROPONINI"  Lab Results   Component Value Date/Time    IRON 123 08/18/2020 03:07 PM    FERRITIN 274.2 08/18/2020 03:07 PM        Inflammatory/Respiratory Diabetes   No results found for: "CRP"  No results found for: "ESR"  ABGs:  No results found for: "PHART", "PO2ART", "HCO3", "PCO2ART"   Lab Results   Component Value Date/Time    CREATININE 0.9 08/16/2022 05:40 PM                 Assessment & Plan     # Acute on chronic hypoxic respiratory failure   # Combined pulmonary fibrosis and emphysema   Follows with Dr. Rubye Beach as outpatient, with recent '2nd opinion' at [facility] due to patient's frustration with rapid progression (2022 onward)   PFTs recently performed at [facility], revealing pseudonormalized ratio and isolated decrease in DLCO   Recently initiated on nintedanib (11/23),  though patient denies any discernible benefit   CTA chest: negative for PE, though 'stable appearance of pulmonary fibrosis' noted, in the UIP pattern. No focal consolidation noted.   TTE (1/24): preserved EF,"unable to measure" RVSP, though normal systolic function of the right ventricle noted   Currently without signs to suggest acute bacterial process. Procalcitonin within normal limits   BNP 1650 (previously 1251, 3035 in 2022); no signs of overt hypervolemia on examination   Received dexamethasone 10mg  IV x1, lasix 40mg  IV x1 + nebulizers in ER   -Will continue solumedrol 40mg  IV Q8H (2/8-)   -Will initiate azithromycin 500mg  IV QD (2/8-)   -Will continue albuterol / ipratropium Q4H ATC   -Continue outpatient budesonide (INH), montelukast   -Patient appears relatively euvolemic; do not suspect that she will benefit from further diuresis, though will monitor response to the lasix received in ER   -Will obtain RVP, though lower suspicion for acute process given reported timeframe of  symptoms   -Patient may ultimately benefit from repeat TTE to evaluate RVSP or RHC, as patient may otherwise benefit from vasodilator therapies   -Pulmonology consulted for further assistance   -Palliative care consulted to discuss further goals of care. Patient currently seems amenable to intubation if necessary, but also demonstrates reservations.     # Hypothyroidism   -Continue outpatient levothyroxine   -Will check TSH/T4 level     CODE STATUS: FULL CODE   DVT prophylaxis: Lovenox   Diet: Sodium restricted   Disposition: Admit for inpatient management     Ivar Drape, MD  08/16/2022 10:41 PM  [facility] Hospitalist Service    This note reflects my/our clinical thought processes and is intended primarily for the exchange of information between healthcare providers.  It is not written primarily to communicate with the patient or family directly, which takes place in person at the bedside.  This note may contain sensitive  information, including substance use, mental health, and consideration of sensitive, serious, or other "do not miss" diagnoses, which is further discussed at the bedside.

## 2022-08-17 LAB — CBC WITH AUTO DIFFERENTIAL
Absolute Eos #: 0 10*3/uL (ref 0.0–0.5)
Absolute Lymph #: 1 10*3/uL (ref 1.0–3.2)
Absolute Mono #: 0.2 10*3/uL — ABNORMAL LOW (ref 0.3–1.0)
Basophils %: 0.1 % (ref 0.0–2.0)
Basophils Absolute: 0 10*3/uL (ref 0.0–0.2)
Eosinophils %: 0.1 % (ref 0.0–7.0)
Hematocrit: 39.9 % (ref 34.0–47.0)
Hemoglobin: 13.6 g/dL (ref 11.5–15.7)
Immature Grans (Abs): 0.02 10*3/uL (ref 0.00–0.06)
Immature Granulocytes: 0.3 % (ref 0.0–0.6)
Lymphocytes: 14.2 % — ABNORMAL LOW (ref 15.0–45.0)
MCH: 31.3 pg (ref 27.0–34.5)
MCHC: 34.1 g/dL (ref 32.0–36.0)
MCV: 91.7 fL (ref 81.0–99.0)
MPV: 9.9 fL (ref 7.2–13.2)
Monocytes: 2.5 % — ABNORMAL LOW (ref 4.0–12.0)
Neutrophils %: 82.8 % — ABNORMAL HIGH (ref 42.0–74.0)
Neutrophils Absolute: 5.9 10*3/uL (ref 1.6–7.3)
Platelets: 174 10*3/uL (ref 140–440)
RBC: 4.35 x10e6/mcL (ref 3.60–5.20)
RDW: 11.7 % (ref 11.0–16.0)
WBC: 7.1 10*3/uL (ref 3.8–10.6)

## 2022-08-17 LAB — COMPREHENSIVE METABOLIC PANEL W/ REFLEX TO MG FOR LOW K
ALT: 32 U/L (ref 0–35)
AST: 22 U/L (ref 0–35)
Albumin/Globulin Ratio: 2 (ref 1.00–2.70)
Albumin: 4.4 g/dL (ref 3.5–5.2)
Alk Phosphatase: 75 U/L (ref 35–117)
Anion Gap: 11 mmol/L (ref 2–17)
BUN: 23 mg/dL (ref 8–23)
CO2: 28 mmol/L (ref 22–29)
Calcium: 10.2 mg/dL (ref 8.8–10.2)
Chloride: 101 mmol/L (ref 98–107)
Creatinine: 1 mg/dL (ref 0.5–1.0)
Est, Glom Filt Rate: 62 mL/min/1.73m (ref >=60)
Globulin: 3 g/dL (ref 1.9–4.4)
Glucose: 154 mg/dL — ABNORMAL HIGH (ref 70–99)
OSMOLALITY CALCULATED: 286 mOsm/kg (ref 270–287)
Potassium: 4.7 mmol/L (ref 3.5–5.3)
Sodium: 140 mmol/L (ref 135–145)
Total Bilirubin: 0.55 mg/dL (ref 0.00–1.20)
Total Protein: 6.9 g/dL (ref 6.4–8.3)

## 2022-08-17 LAB — RESPIRATORY PANEL, MOLECULAR
Adenovirus: NOT DETECTED
Bordetella Parapertussis: NOT DETECTED
Bordetella Pertussis: NOT DETECTED
CORONAVIRUS 229E: NOT DETECTED
CORONAVIRUS HKU1: NOT DETECTED
CORONAVIRUS NL63: NOT DETECTED
CORONAVIRUS OC43: NOT DETECTED
Chlamydia Pneumoniae: NOT DETECTED
Human Metapneumovirus: NOT DETECTED
Human Rhinovirus/Enterovirus: NOT DETECTED
INFLUENZA A: NOT DETECTED
INFLUENZA B: NOT DETECTED
MYCOPLASMA PNEUMONIAE: NOT DETECTED
PARAINFLUENZA 4: NOT DETECTED
Parainfluenza 1: NOT DETECTED
Parainfluenza 2: NOT DETECTED
Parainfluenza 3: NOT DETECTED
Respiratory Syncytial Virus: NOT DETECTED
SARS-CoV-2: NOT DETECTED

## 2022-08-17 LAB — TSH WITH REFLEX: TSH: 0.815 mcIU/mL (ref 0.358–3.740)

## 2022-08-17 MED ORDER — NORMAL SALINE FLUSH 0.9 % IV SOLN
0.9 % | Freq: Two times a day (BID) | INTRAVENOUS | Status: AC
Start: 2022-08-17 — End: 2022-08-18
  Administered 2022-08-17 – 2022-08-18 (×4): 10 mL via INTRAVENOUS

## 2022-08-17 MED ORDER — DICLOFENAC SODIUM 1 % EX GEL
1 % | Freq: Four times a day (QID) | CUTANEOUS | Status: AC | PRN
Start: 2022-08-17 — End: 2022-08-18

## 2022-08-17 MED ORDER — IPRATROPIUM-ALBUTEROL 0.5-2.5 (3) MG/3ML IN SOLN
0.5-2.533 (3) MG/3ML | RESPIRATORY_TRACT | Status: AC
Start: 2022-08-17 — End: 2022-08-18
  Administered 2022-08-17 – 2022-08-18 (×5): 1 via RESPIRATORY_TRACT

## 2022-08-17 MED ORDER — MAGNESIUM OXIDE -MG SUPPLEMENT 400 (240 MG) MG PO TABS
400240 (240 Mg) MG | Freq: Every day | ORAL | Status: AC
Start: 2022-08-17 — End: 2022-08-18
  Administered 2022-08-17 – 2022-08-18 (×2): 400 mg via ORAL

## 2022-08-17 MED ORDER — METHYLPREDNISOLONE NA SUC (PF) 40 MG IJ SOLR
40 | Freq: Four times a day (QID) | INTRAMUSCULAR | Status: DC
Start: 2022-08-17 — End: 2022-08-16

## 2022-08-17 MED ORDER — ATORVASTATIN CALCIUM 10 MG PO TABS
10 MG | Freq: Every day | ORAL | Status: AC
Start: 2022-08-17 — End: 2022-08-18
  Administered 2022-08-17 – 2022-08-18 (×2): 10 mg via ORAL

## 2022-08-17 MED ORDER — IOPAMIDOL 76 % IV SOLN
76 | Freq: Once | INTRAVENOUS | Status: AC | PRN
Start: 2022-08-17 — End: 2022-08-16
  Administered 2022-08-17: 02:00:00 100 mL via INTRAVENOUS

## 2022-08-17 MED ORDER — CEFTRIAXONE SODIUM 1 G IJ SOLR
1 | INTRAMUSCULAR | Status: DC
Start: 2022-08-17 — End: 2022-08-16

## 2022-08-17 MED ORDER — TRAZODONE HCL 50 MG PO TABS
50 MG | Freq: Every evening | ORAL | Status: AC | PRN
Start: 2022-08-17 — End: 2022-08-18
  Administered 2022-08-17 – 2022-08-18 (×2): 100 mg via ORAL

## 2022-08-17 MED ORDER — SODIUM CHLORIDE 0.9 % IV SOLN
0.9 % | INTRAVENOUS | Status: AC | PRN
Start: 2022-08-17 — End: 2022-08-18

## 2022-08-17 MED ORDER — LEVOTHYROXINE SODIUM 88 MCG PO TABS
88 MCG | Freq: Every day | ORAL | Status: AC
Start: 2022-08-17 — End: 2022-08-18
  Administered 2022-08-17 – 2022-08-18 (×2): 88 ug via ORAL

## 2022-08-17 MED ORDER — ONDANSETRON HCL 4 MG/2ML IJ SOLN
42 MG/2ML | Freq: Four times a day (QID) | INTRAMUSCULAR | Status: AC | PRN
Start: 2022-08-17 — End: 2022-08-18

## 2022-08-17 MED ORDER — PANTOPRAZOLE SODIUM 40 MG PO TBEC
40 MG | Freq: Every day | ORAL | Status: AC
Start: 2022-08-17 — End: 2022-08-18
  Administered 2022-08-17 – 2022-08-18 (×2): 40 mg via ORAL

## 2022-08-17 MED ORDER — HYDROXYZINE HCL 10 MG PO TABS
10 MG | Freq: Three times a day (TID) | ORAL | Status: AC | PRN
Start: 2022-08-17 — End: 2022-08-18
  Administered 2022-08-18: 04:00:00 10 mg via ORAL

## 2022-08-17 MED ORDER — LEVOFLOXACIN 250 MG PO TABS
250 MG | Freq: Every day | ORAL | Status: AC
Start: 2022-08-17 — End: 2022-08-22
  Administered 2022-08-17 – 2022-08-18 (×2): 500 mg via ORAL

## 2022-08-17 MED ORDER — MONTELUKAST SODIUM 10 MG PO TABS
10 MG | Freq: Every evening | ORAL | Status: AC
Start: 2022-08-17 — End: 2022-08-18
  Administered 2022-08-17 – 2022-08-18 (×2): 10 mg via ORAL

## 2022-08-17 MED ORDER — METHYLPREDNISOLONE NA SUC (PF) 40 MG IJ SOLR
40 MG | Freq: Three times a day (TID) | INTRAMUSCULAR | Status: AC
Start: 2022-08-17 — End: 2022-08-18
  Administered 2022-08-17 – 2022-08-18 (×4): 40 mg via INTRAVENOUS

## 2022-08-17 MED ORDER — FLUTICASONE FUROATE-VILANTEROL 200-25 MCG/ACT IN AEPB
200-25 MCG/ACT | Freq: Every day | RESPIRATORY_TRACT | Status: AC
Start: 2022-08-17 — End: 2022-08-18
  Administered 2022-08-17 – 2022-08-18 (×2): 1 via RESPIRATORY_TRACT

## 2022-08-17 MED ORDER — POLYETHYLENE GLYCOL 3350 17 G PO PACK
17 g | Freq: Every day | ORAL | Status: AC | PRN
Start: 2022-08-17 — End: 2022-08-18

## 2022-08-17 MED ORDER — AZITHROMYCIN 250 MG PO TABS
250 | Freq: Every day | ORAL | Status: DC
Start: 2022-08-17 — End: 2022-08-17

## 2022-08-17 MED ORDER — ACETAMINOPHEN 325 MG PO TABS
325 | Freq: Four times a day (QID) | ORAL | Status: DC | PRN
Start: 2022-08-17 — End: 2022-08-18
  Administered 2022-08-17 – 2022-08-18 (×4): 650 mg via ORAL

## 2022-08-17 MED ORDER — NORMAL SALINE FLUSH 0.9 % IV SOLN
0.9 % | INTRAVENOUS | Status: AC | PRN
Start: 2022-08-17 — End: 2022-08-18
  Administered 2022-08-18: 04:00:00 10 mL via INTRAVENOUS

## 2022-08-17 MED ORDER — GUAIFENESIN ER 600 MG PO TB12
600 MG | Freq: Two times a day (BID) | ORAL | Status: AC
Start: 2022-08-17 — End: 2022-08-18
  Administered 2022-08-17 – 2022-08-18 (×4): 600 mg via ORAL

## 2022-08-17 MED ORDER — ONDANSETRON 4 MG PO TBDP
4 MG | Freq: Three times a day (TID) | ORAL | Status: AC | PRN
Start: 2022-08-17 — End: 2022-08-18

## 2022-08-17 MED ORDER — ENOXAPARIN SODIUM 40 MG/0.4ML IJ SOSY
400.4 MG/0.4ML | Freq: Every day | INTRAMUSCULAR | Status: AC
Start: 2022-08-17 — End: 2022-08-18
  Administered 2022-08-17 – 2022-08-18 (×2): 40 mg via SUBCUTANEOUS

## 2022-08-17 MED ORDER — ACETAMINOPHEN 650 MG RE SUPP
650 | Freq: Four times a day (QID) | RECTAL | Status: DC | PRN
Start: 2022-08-17 — End: 2022-08-18

## 2022-08-17 MED FILL — IPRATROPIUM-ALBUTEROL 0.5-2.5 (3) MG/3ML IN SOLN: RESPIRATORY_TRACT | Qty: 3

## 2022-08-17 MED FILL — ENOXAPARIN SODIUM 40 MG/0.4ML IJ SOSY: 40 MG/0.4ML | INTRAMUSCULAR | Qty: 0.4

## 2022-08-17 MED FILL — BREO ELLIPTA 200-25 MCG/ACT IN AEPB: 200-25 MCG/ACT | RESPIRATORY_TRACT | Qty: 28

## 2022-08-17 MED FILL — TRAZODONE HCL 50 MG PO TABS: 50 MG | ORAL | Qty: 2

## 2022-08-17 MED FILL — TYLENOL 325 MG PO TABS: 325 MG | ORAL | Qty: 2

## 2022-08-17 MED FILL — MONTELUKAST SODIUM 10 MG PO TABS: 10 MG | ORAL | Qty: 1

## 2022-08-17 MED FILL — LEVOTHYROXINE SODIUM 88 MCG PO TABS: 88 MCG | ORAL | Qty: 1

## 2022-08-17 MED FILL — DICLOFENAC SODIUM 1 % EX GEL: 1 % | CUTANEOUS | Qty: 100

## 2022-08-17 MED FILL — MUCINEX 600 MG PO TB12: 600 MG | ORAL | Qty: 1

## 2022-08-17 MED FILL — ATORVASTATIN CALCIUM 10 MG PO TABS: 10 MG | ORAL | Qty: 1

## 2022-08-17 MED FILL — MIRALAX 17 G PO PACK: 17 g | ORAL | Qty: 1

## 2022-08-17 MED FILL — HYDROXYZINE HCL 10 MG PO TABS: 10 MG | ORAL | Qty: 1

## 2022-08-17 MED FILL — LEVOFLOXACIN 250 MG PO TABS: 250 MG | ORAL | Qty: 2

## 2022-08-17 MED FILL — SOLU-MEDROL (PF) 40 MG IJ SOLR: 40 MG | INTRAMUSCULAR | Qty: 40

## 2022-08-17 MED FILL — MAGNESIUM OXIDE -MG SUPPLEMENT 400 (240 MG) MG PO TABS: 400 (240 Mg) MG | ORAL | Qty: 1

## 2022-08-17 MED FILL — PANTOPRAZOLE SODIUM 40 MG PO TBEC: 40 MG | ORAL | Qty: 1

## 2022-08-17 MED FILL — MONOJECT FLUSH SYRINGE 0.9 % IV SOLN: 0.9 % | INTRAVENOUS | Qty: 10

## 2022-08-17 NOTE — Care Coordination-Inpatient (Signed)
08/17/22 1619   Service Assessment   Patient Orientation Alert and Oriented   Cognition Alert   History Provided By Patient   Primary [place]   PCP Verified by CM Yes  Kara Dies)   Prior Functional Level Independent in ADLs/IADLs   Current Functional Level Independent in ADLs/IADLs   Can patient return to prior living arrangement Yes   Ability to make needs known: Good   Family able to assist with home care needs: Yes   Would you like for me to discuss the discharge plan with any other family members/significant others, and if so, who? Yes  (Daughter, Randell Loop)   Financial Resources [employer]   Community Resources None   Social/Functional History   Lives With Alone   Type of [place] One level   Home Access Stairs to enter with rails   Entrance Stairs - Number of Steps 3   Entrance Stairs - [place] Help From Family   ADL Assistance Independent   Copywriter, advertising Yes   Medical laboratory scientific officer Yes   Mode of Scientist, product/process development   Occupation Retired   Dentist   Type of [place] Prior To Admission None   Potential Assistance Needed N/A   DME Ordered? No   Potential Assistance Purchasing Medications No   Type of Home Care Services None   Patient expects to be discharged to: [facility]     08/17/22 CW: Pt admitted for acute on chronic hypoxic respiratory failure. Assessed at [facility]. Pt is IADLs, drives, lives alone. Daughter, Randell Loop, is emergency contact, verbal authorization obtained to discuss POC w/daughter. PCP is Kara Dies; Pharmacy is [place] Pt has home O2, was going to start [facility] PT w/[facility], but that is currently on hold. She will need new orders at d/c if the plan is to begin HHPT when she returns home. Pt's daughter will transport  at d/c. Per MD rounds, a palliative care consult has been place. Pt likely to d/c tomorrow.    D/c plan Home vs Home w/HHPT

## 2022-08-17 NOTE — Progress Notes (Addendum)
[facility] Hospitalist Service     Hospitalist Progress Note     PCP: Gean Birchwood, MD   Admission Date: 08/16/2022        Subjective   Currently on 4 L/min.  She is comfortable at rest but reports severe dyspnea with minimal exertion.  She has deep cough with some sputum production, mostly clear in color.  No chest pain.  Had some lower extremity edema at presentation which improved after receiving furosemide in the ED.    She reports that she recently completed outpatient course of Augmentin and prednisone taper prescribed by Dr. Catarina Hartshorn.  She recently discontinued Ofev due to fatigue and diarrhea.    Case discussed with patient, patient's daughter Judeen Hammans at the bedside, and with Dr. Arlis Porta from pulmonology.    Overnight activity reviewed with nursing staff.       Objective   Blood pressure 124/80, pulse 68, temperature 97.6 F (36.4 C), temperature source Oral, resp. rate 18, height 1.575 m (5\' 2" ), weight 72.8 kg (160 lb 9.6 oz), SpO2 98 %.  Body mass index is 29.37 kg/m.      Intake/Output Summary (Last 24 hours) at 08/17/2022 0840  Last data filed at 08/17/2022 0640  Gross per 24 hour   Intake 110 ml   Output 450 ml   Net -340 ml         Physical Exam:  Awake alert oriented x 3  Lungs with dry crackles at bases bilaterally, increased work of breathing.  No wheezing  CV: Regular rate rhythm without murmur  No lower extremity edema  Skin is warm and well-perfused  Abdomen soft and nontender     Labs and Imaging     Data: I have personally reviewed all lab results and independently reviewed imaging studies performed in the past 24 hours.    Hematologic/Coags Chemistries   Recent Labs     08/16/22  1740 08/17/22  0530   WBC 9.8 7.1   HGB 13.7 13.6   HCT 40.9 39.9   PLT 163 174     Lab Results   Component Value Date/Time    PROT 6.9 08/17/2022 05:30 AM     No components found for: "HGBA1C"  Lab Results   Component Value Date/Time    INR 1.0 01/11/2022 03:14 PM    PROTIME 13.2 01/11/2022 03:14 PM     Lab  Results   Component Value Date/Time    APTT 31.0 01/11/2022 03:14 PM     No results found for: "DDIMER"   Recent Labs     08/16/22  1740 08/17/22  0530   NA 142 140   K 3.6 4.7   CL 104 101   CO2 25 28   BUN 22 23   CREATININE 0.9 1.0   BILITOT 0.47 0.55   ALKPHOS 76 75   AST 27 22   ALT 32 32     No results for input(s): "GLU" in the last 72 hours.  No results found for: "CPK", "CKMB", "TROPONINI"  Lab Results   Component Value Date/Time    IRON 123 08/18/2020 03:07 PM    FERRITIN 274.2 08/18/2020 03:07 PM        Inflammatory/Respiratory Diabetes   No results found for: "CRP"  No results found for: "ESR"  ABGs:  No results found for: "PHART", "PO2ART", "HCO3", "PCO2ART"   Lab Results   Component Value Date/Time    CREATININE 1.0 08/17/2022 05:30 AM  Studies:  CTA CHEST W WO CONTRAST   Final Result      No evidence of pulmonary embolus.      Stable appearance of pulmonary fibrosis - UIP pattern and emphysema. No focal    consolidation.      CT CHEST WO CONTRAST   Final Result   1. 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      2. Emphysema.      3. No acute abnormality.      XR CHEST PORTABLE   Final Result      Cardiomediastinal silhouette is unchanged. Chronic lower lobe predominant    interstitial airspace opacities appear similar in configuration to prior    examination and compatible with history of pulmonary fibrosis. Superimposed    infection would be difficult to exclude given extent. No large pleural effusion.    No pneumothorax. Otherwise, stable exam.          Medications      sodium chloride flush  5-40 mL IntraVENous 2 times per day    enoxaparin  40 mg SubCUTAneous Daily    azithromycin  500 mg Oral Daily    ipratropium 0.5 mg-albuterol 2.5 mg  1 Dose Inhalation Q4H WA RT    guaiFENesin  600 mg Oral BID    methylPREDNISolone  40 mg IntraVENous Q8H    fluticasone furoate-vilanterol  1 puff Inhalation Daily    levothyroxine  88 mcg Oral QAM AC     montelukast  10 mg Oral Nightly       sodium chloride        (Note: the above list excludes PRNs)    Assessment & Plan     Hospital Problems             Last Modified POA    * (Principal) Acute on chronic hypoxic respiratory failure ([place]) 08/17/2022 Yes    Interstitial lung disease ([place]) 08/17/2022 Yes    Pulmonary fibrosis ([place]) 08/17/2022 Yes       Pulmonary fibrosis and severe COPD with exacerbation  Acute on chronic hypoxemic respiratory failure:  CT reviewed.  Negative for PE.  Severe fibrosis and bullous emphysema.  No clear pulmonary infiltrate but difficult to rule out with degree of fibrosis.  BNP was elevated at 1650.  Performed as an outpatient on January 30 showed normal LV function, normal RV function.    -Will treat empirically for COPD exacerbation and community-acquired pneumonia  -Continue IV Solu-Medrol  -Change azithromycin to levofloxacin  -Obtain sputum culture  -Pulmonology consultation  -Hold on further diuresis      I had a frank discussion with the patient and her daughter at the bedside.  As Dr. Eber Jones explained in August, her disease process is chronic and will be progressive.  I am unsure if she has an acute process which can be effectively modified versus worsening chronic condition.    Patient was very upset and tearful about the possibility of living with this degree of lung disease without improvement.  Palliative care has been consulted and I advised her to consider hospice in the future when her situation worsens.    ADULT DIET; Regular; 5 carb choices (75 gm/meal); Low Fat/Low Chol/High Fiber/2 gm Na   Full Code  DVT ppx: Lovenox    Edyth Gunnels, MD  08/17/2022 8:40 AM  [facility] Hospitalist Service    ++++++++++++++++++++++++++++++++++++++++    This note was created using voice recognition software and  may contain typographic errors missed during final review. The intent is to have a complete and accurate medical record.   As a valued partner in this safety effort, if you have noted  factual errors, please contact the [facility] Hospitalist Service at [phone].

## 2022-08-17 NOTE — Consults (Signed)
Consult Note            Date:08/17/2022        Patient Name:Melanie Sandoval     Date of Birth:10-27-1955     Age:67 y.o.    Reason for Consult: Hypoxemia, pulmonary fibrosis, emphysema    Chief Complaint     Chief Complaint   Patient presents with    Shortness of Breath     Increased SOB, fatigue for 3 weeks. Pt has pulm fibrosis, wears home o2 but she needed to increase to 4lt from 2.5lt recently due to SOB. Saw PCP on the 16th, being treated for pneu since then. Symptoms not getting better.          History Obtained From   patient    History of Present Illness   Melanie Sandoval is a 67 year old female who is followed in our outpatient pulmonary practice by Dr. Rubye Beach.  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  She has been progressively worsened and her symptoms of shortness of breath over time following a hospitalization November 2022.  CT scans back in 2022 showed severe emphysema, and some changes of early fibrosis.  She has had progressive worsening pulmonary fibrosis in a UIP/IPF type pattern with peripheral honeycombing in the bases since that time.  She has chronic hypoxemic respiratory failure and is on home oxygen.  She is being treated with antifibrotic therapy on Ofev for the pulmonary fibrosis.  She has been unable to tolerate the medication due to side effects.  She has been evaluated at [facility] for this issue as well.  We have worked up the possibility of chronic lung disease induced pulmonary hypertension with an echocardiogram recently which did not show a significantly normal RV function, RVSP was unable to be estimated.    She presented to the emergency department with progressive dyspnea, specifically worse over the last month.  She has had a progressive functional decline and the ability to perform her ADLs.  She was worked up for her shortness of breath in the emergency department with a CT pulmonary angiogram which was negative for PE.  It  showed chronic emphysema and pulmonary fibrosis changes very similar to her prior study from August 2023.  I independently reviewed the study along with Dr. Milly Jakob (her attending physician).  There may be minimal slight groundglass infiltrate in the right middle lobe, but even this does not appear to be changed all that much going back to more remote studies.  She has not had a leukocytosis or fevers.  Procalcitonin was negative.  Respiratory viral panel negative.    Past Medical History     Past Medical History:   Diagnosis Date    Asthma     COPD (chronic obstructive pulmonary disease) ([place])     Hyperlipidemia     Hypertension     Hypothyroidism     Pulmonary fibrosis ([place])         Past Surgical History     Past Surgical History:   Procedure Laterality Date    ELBOW SURGERY Left     KNEE ARTHROSCOPY Right     THYROID SURGERY          Medications     Prior to Admission medications    Medication Sig Start Date End Date Taking? Authorizing Provider   predniSONE (DELTASONE) 10 MG tablet Take 1 tablet by mouth daily PATIENT IS TAKING THIS MED, OTHER PREDNISONE WAS A TAPERING DOSE THAT WAS  FINISHED   Yes [provider]   Tavaborole 5 % SOLN Apply 1 applicator topically daily  Patient not taking: Reported on 08/16/2022 08/02/22 09/01/22  Baldo Daub, DPM   Magnesium 400 MG TABS Take 1 tablet by mouth in the morning and at bedtime  Patient not taking: Reported on 08/16/2022 07/25/22   Gean Birchwood, MD   levothyroxine (SYNTHROID) 88 MCG tablet 1 tablet daily by mouth 06/18/22   Gean Birchwood, MD   montelukast (SINGULAIR) 10 MG tablet Take 1 tablet by mouth nightly 06/18/22 06/13/23  Gean Birchwood, MD   atorvastatin (LIPITOR) 10 MG tablet Take 1 tablet by mouth daily 06/18/22   Gean Birchwood, MD   hydrOXYzine HCl (ATARAX) 25 MG tablet Take 1 tablet by mouth twice daily as needed for itching or anxiety  Patient taking differently: Take 1 tablet by mouth every 8  hours as needed for Itching Take 1 tablet by mouth twice daily as needed for itching or anxiety 06/18/22   Gean Birchwood, MD   traZODone (DESYREL) 100 MG tablet Take 1 tablet by mouth nightly as needed for Sleep 06/18/22   Gean Birchwood, MD   OFEV 150 MG CAPS Take 1 tablet by mouth in the morning and at bedtime 05/22/22   [provider]   azithromycin (ZITHROMAX) 250 MG tablet Take 1 tablet by mouth three times a week    [provider]   BREZTRI AEROSPHERE 160-9-4.8 MCG/ACT AERO INHALE 2 PUFFS TWICE DAILY FOR 90 DAYS 09/07/21   [provider]   predniSONE (DELTASONE) 10 MG tablet TAKE 4 TABLETS BY MOUTH ONCE DAILY FOR 3 DAYS, 3 ONCE DAILY FOR 3 DAYS, 2 ONCE DAILY FOR 3 DAYS, 1 ONCE DAILY FOR 3 DAYS, 1/2 (ONE-HALF) FOR 3 DAYS FOR A TOTAL OF 15 DAYS.  Patient not taking: Reported on 08/16/2022 10/03/21   [provider]   diclofenac sodium (VOLTAREN) 1 % GEL Apply 2 g topically 4 times daily as needed for Pain 10/05/21   [place], [place]un[place] C, APRN - NP   ipratropium-albuterol (DUONEB) 0.5-2.5 (3) MG/3ML SOLN nebulizer solution USE 3 ML IN NEBULIZER EVERY 6 HOURS FOR 30 DAYS 05/26/21   [provider]   albuterol sulfate HFA (PROVENTIL;VENTOLIN;PROAIR) 108 (90 Base) MCG/ACT inhaler 1 puff as needed Inhalation every 4 hrs    [provider]   magnesium oxide (MAG-OX) 400 MG tablet Take 1 tablet by mouth daily    [provider]   Cholecalciferol (VITAMIN D) 50 MCG (2000 UT) CAPS capsule Take 2,000 Units by mouth daily    [provider]   omeprazole (PRILOSEC OTC) 20 MG tablet Take 1 tablet by mouth daily    [provider]        levoFLOXacin (LEVAQUIN) tablet 500 mg, Daily  sodium chloride flush 0.9 % injection 5-40 mL, 2 times per day  sodium chloride flush 0.9 % injection 5-40 mL, PRN  0.9 % sodium chloride infusion, PRN  ondansetron (ZOFRAN-ODT) disintegrating tablet 4 mg, Q8H PRN   Or  ondansetron (ZOFRAN)  injection 4 mg, Q6H PRN  polyethylene glycol (GLYCOLAX) packet 17 g, Daily PRN  enoxaparin (LOVENOX) injection 40 mg, Daily  acetaminophen (TYLENOL) tablet 650 mg, Q6H PRN   Or  acetaminophen (TYLENOL) suppository 650 mg, Q6H PRN  ipratropium 0.5 mg-albuterol 2.5 mg (DUONEB) nebulizer solution 1 Dose, Q4H WA RT  guaiFENesin (MUCINEX) extended release tablet 600 mg, BID  methylPREDNISolone sodium (PF) (SOLU-MEDROL PF)  injection 40 mg, Q8H  fluticasone furoate-vilanterol (BREO ELLIPTA) 200-25 MCG/ACT inhaler 1 puff, Daily  hydrOXYzine HCl (ATARAX) tablet 10 mg, TID PRN  levothyroxine (SYNTHROID) tablet 88 mcg, QAM AC  montelukast (SINGULAIR) tablet 10 mg, Nightly  traZODone (DESYREL) tablet 100 mg, Nightly PRN        Allergies   Patient has no known allergies.    Social History     Social History       Tobacco History       Smoking Status  Former Current Packs/Day  1 pack/day Average Packs/Day  1 pack/day for 50.0 years (50.0 ttl pk-yrs) Smoking Tobacco Type  Cigarettes   Pack Year History     Packs/Day From To Years    1   50.0      Smokeless Tobacco Use  Never              Alcohol History       Alcohol Use Status  Never              Drug Use       Drug Use Status  Never              Sexual Activity       Sexually Active  Not Asked                    Family History     Family History   Problem Relation Age of Onset    Heart Attack Father     Breast Cancer Maternal Aunt        Review of Systems   Review of systems as per the HPI, otherwise 12 point review of systems obtained and negative.    Physical Exam   BP (!) 141/94   Pulse 77   Temp 98.1 F (36.7 C) (Oral)   Resp 18   Ht 1.575 m (5\' 2" )   Wt 72.8 kg (160 lb 9.6 oz)   SpO2 96%   BMI 29.37 kg/m     General: Alert, cooperative, no obvious distress  Neck: supple, no thyroidmegaly  Lungs: Crackles noted to auscultation bilaterally, non-labored respirations, no wheezing  Heart: Normal rate, regular rhythm, no murmur  Musculoskeletal: Warm, well perfused, no  joint swelling or tenderness  Abdomen: Soft, non-tender, non-distended, normal bowel sounds  Neurologic: No focal weakness, CN II-XII intact  Extremities: no cyanosis, no edema     Labs    CBC:  Recent Labs     08/16/22  1740 08/17/22  0530   WBC 9.8 7.1   RBC 4.47 4.35   HGB 13.7 13.6   HCT 40.9 39.9   MCV 91.5 91.7   RDW 11.9 11.7   PLT 163 174     CHEMISTRIES:  Recent Labs     08/16/22  1740 08/17/22  0530   NA 142 140   K 3.6 4.7   CL 104 101   CO2 25 28   BUN 22 23   CREATININE 0.9 1.0   GLUCOSE 94 154*     PT/INR:No results for input(s): "PROTIME", "INR" in the last 72 hours.  APTT:No results for input(s): "APTT" in the last 72 hours.  LIVER PROFILE:  Recent Labs     08/16/22  1740 08/17/22  0530   AST 27 22   ALT 32 32   BILITOT 0.47 0.55   ALKPHOS 76 75       Imaging/Diagnostics   CTA CHEST W [place]  Result Date: 08/16/2022  No evidence of pulmonary embolus. Stable appearance of pulmonary fibrosis - UIP pattern and emphysema. No focal consolidation.    CT CHEST WO CONTRAST    Result Date: 08/16/2022  1. Extensive pulmonary fibrosis in UIP pattern, stable to slightly progressed from 2022. 2. Emphysema. 3. No acute abnormality.    XR CHEST PORTABLE    Result Date: 08/16/2022  Cardiomediastinal silhouette is unchanged. Chronic lower lobe predominant interstitial airspace opacities appear similar in configuration to prior examination and compatible with history of pulmonary fibrosis. Superimposed infection would be difficult to exclude given extent. No large pleural effusion.  No pneumothorax. Otherwise, stable exam.      Assessment      Hospital Problems             Last Modified POA    * (Principal) Acute on chronic hypoxic respiratory failure (HCC) 08/17/2022 Yes    Interstitial lung disease (HCC) 08/17/2022 Yes    Pulmonary fibrosis (HCC) 08/17/2022 Yes        Plan   -Acute on chronic hypoxemic respiratory failure  -Combined severe pulmonary fibrosis with bullous emphysema  -Possible right middle lobe pulmonary  infiltrate    -Her CT scan looks fairly similar to the prior study done several months ago.  There may be slight more increased infiltrate in the right middle lobe, though this is not definite.      -Her symptoms could just be progressive disease over time.  Review of outpatient records seem to indicate that she has struggled with progressive decline over the past year or so.    -Echocardiogram did not show significant RV dysfunction, but RVSP could not be estimated.      -Given the findings on CT possible acute exacerbation.  Recommend empiric treatment for COPD exacerbation and potential early pneumonia right middle lobe with IV steroids followed by a prednisone taper as an outpatient, and a 7-day course of Levaquin for community-acquired pneumonia.  Follow-up cultures and adjust if indicated.  Other lab workup and presentation seems to suggest that infection is less likely, but difficult to completely rule out given her underlying severe lung disease.    -Consider right heart catheterization as an outpatient to see if she is a candidate for pulmonary vasodilators in the future, but this would most likely have limited benefit in her overall disease progression and quality of life.    -If she can have enough recovery to do pulmonary rehab, she could be considered for lung transplant depending on lung transplant team at [facility] evaluation.    -I discussed with her and her family that her presentation could be the natural progression of her disease over time and not an acute illness.  We discussed hospice options in the future.  She will need to go home with enough supplemental oxygen that she can perform basic functions, may need something where she can get up to 10 L/min nasal cannula.              Electronically signed by Toni Amend, MD on 08/17/22 at 9:30 AM EST

## 2022-08-17 NOTE — Progress Notes (Signed)
Patient currently uses [facility] for home O2 concentrator. Current tank does not go up to 10L. Patient and daughter interested in speaking to case management about options for obtaining tank that does go to 10L.    Also interested in DME wheelchair and bedside commode.     Day RN to pass on to night RN.

## 2022-08-17 NOTE — Plan of Care (Signed)
Problem: Discharge Planning  Goal: Discharge to home or other facility with appropriate resources  Outcome: Progressing     Problem: Safety - Adult  Goal: Free from fall injury  Outcome: Progressing

## 2022-08-18 LAB — EKG 12-LEAD
P Axis: 40 degrees
P-R Interval: 128 ms
Q-T Interval: 396 ms
QRS Duration: 72 ms
QTc Calculation (Bazett): 414 ms
R Axis: 56 degrees
T Axis: 45 degrees
Ventricular Rate: 68 {beats}/min

## 2022-08-18 MED ORDER — LEVOFLOXACIN 500 MG PO TABS
500 | ORAL_TABLET | Freq: Every day | ORAL | 0 refills | Status: AC
Start: 2022-08-18 — End: 2022-08-23

## 2022-08-18 MED ORDER — PREDNISONE 10 MG PO TABS
10 | ORAL_TABLET | ORAL | 0 refills | Status: AC
Start: 2022-08-18 — End: 2022-08-31

## 2022-08-18 MED ORDER — LORAZEPAM 1 MG PO TABS
1 | ORAL_TABLET | Freq: Three times a day (TID) | ORAL | 0 refills | Status: AC | PRN
Start: 2022-08-18 — End: 2022-08-21

## 2022-08-18 MED FILL — MUCINEX 600 MG PO TB12: 600 MG | ORAL | Qty: 1

## 2022-08-18 MED FILL — IPRATROPIUM-ALBUTEROL 0.5-2.5 (3) MG/3ML IN SOLN: RESPIRATORY_TRACT | Qty: 3

## 2022-08-18 MED FILL — SOLU-MEDROL (PF) 40 MG IJ SOLR: 40 MG | INTRAMUSCULAR | Qty: 40

## 2022-08-18 MED FILL — MONOJECT FLUSH SYRINGE 0.9 % IV SOLN: 0.9 % | INTRAVENOUS | Qty: 20

## 2022-08-18 MED FILL — TYLENOL 325 MG PO TABS: 325 MG | ORAL | Qty: 2

## 2022-08-18 MED FILL — LEVOFLOXACIN 250 MG PO TABS: 250 MG | ORAL | Qty: 2

## 2022-08-18 MED FILL — LEVOTHYROXINE SODIUM 88 MCG PO TABS: 88 MCG | ORAL | Qty: 1

## 2022-08-18 MED FILL — MAGNESIUM OXIDE -MG SUPPLEMENT 400 (240 MG) MG PO TABS: 400 (240 Mg) MG | ORAL | Qty: 1

## 2022-08-18 MED FILL — ENOXAPARIN SODIUM 40 MG/0.4ML IJ SOSY: 40 MG/0.4ML | INTRAMUSCULAR | Qty: 0.4

## 2022-08-18 MED FILL — PANTOPRAZOLE SODIUM 40 MG PO TBEC: 40 MG | ORAL | Qty: 1

## 2022-08-18 MED FILL — ATORVASTATIN CALCIUM 10 MG PO TABS: 10 MG | ORAL | Qty: 1

## 2022-08-18 MED FILL — MONTELUKAST SODIUM 10 MG PO TABS: 10 MG | ORAL | Qty: 1

## 2022-08-18 MED FILL — TRAZODONE HCL 50 MG PO TABS: 50 MG | ORAL | Qty: 2

## 2022-08-18 MED FILL — HYDROXYZINE HCL 10 MG PO TABS: 10 MG | ORAL | Qty: 1

## 2022-08-18 MED FILL — MONOJECT FLUSH SYRINGE 0.9 % IV SOLN: 0.9 % | INTRAVENOUS | Qty: 30

## 2022-08-18 NOTE — Care Coordination-Inpatient (Signed)
Pt has dc orders home with home hospice through [facility]. SW called and spoke with the pts daughter, Judeen Hammans to discuss dc planning and hospice choice. Pt  is currently on o2 but is a lower flow and will need 6+L for home o2, bedside commode and a wheelchair. [place] will deliver all the DME, including the o2. Judeen Hammans reports pt has a remote controlled bed, and therefore will not need a hospital bed. Pts daughter will transport her home. No other CM needs are identified per the dc order and notes. SW is available if any needs arise prior to dc.    08/18/22 1232   Discharge Planning   Type of Residence House   Patient expects to be discharged to: [facility]   Transition of Care Consult (CM Consult) Discharge Planning;Hospice   Internal Hospice Yes   Services At/After Discharge Hospice   Mode of Transport at Discharge Other (see comment)

## 2022-08-18 NOTE — Progress Notes (Signed)
Pulmonary Progress Note    Date:08/18/2022       Room:3108/01  Patient Name:Melanie Sandoval     Date of Birth:June 16, 1956     Age:67 y.o.    Subjective   Interval History Status: not changed.     She feels slightly improved from yesterday, supplemental oxygen requirements remain the same.    Review of Systems   ROS as previously reviewed     Medications   Scheduled Meds:    levoFLOXacin  500 mg Oral Daily    atorvastatin  10 mg Oral Daily    magnesium oxide  400 mg Oral Daily    pantoprazole  40 mg Oral QAM AC    sodium chloride flush  5-40 mL IntraVENous 2 times per day    enoxaparin  40 mg SubCUTAneous Daily    ipratropium 0.5 mg-albuterol 2.5 mg  1 Dose Inhalation Q4H WA RT    guaiFENesin  600 mg Oral BID    methylPREDNISolone  40 mg IntraVENous Q8H    fluticasone furoate-vilanterol  1 puff Inhalation Daily    levothyroxine  88 mcg Oral QAM AC    montelukast  10 mg Oral Nightly     Continuous Infusions:    sodium chloride       PRN Meds: diclofenac sodium, sodium chloride flush, sodium chloride, ondansetron **OR** ondansetron, polyethylene glycol, acetaminophen **OR** acetaminophen, hydrOXYzine HCl, traZODone      Physical Examination      Vitals:  BP (!) 145/89   Pulse 68   Temp 97.7 F (36.5 C) (Oral)   Resp 18   Ht 1.575 m (5\' 2" )   Wt 72.8 kg (160 lb 9.6 oz)   SpO2 95%   BMI 29.37 kg/m   Temp (24hrs), Avg:98 F (36.7 C), Min:97.7 F (36.5 C), Max:98.3 F (36.8 C)      I/O (24Hr):    Intake/Output Summary (Last 24 hours) at 08/18/2022 0827  Last data filed at 08/18/2022 0541  Gross per 24 hour   Intake 1100 ml   Output --   Net 1100 ml       General: Alert, cooperative, no obvious distress  Neck: supple, no thyroidmegaly  Lungs: Crackles noted to auscultation bilaterally, non-labored respirations, no wheezing  Heart: Normal rate, regular rhythm, no murmur  Musculoskeletal: Warm, well perfused, no joint swelling or tenderness  Abdomen: Soft, non-tender, non-distended, normal bowel  sounds  Neurologic: No focal weakness, CN II-XII intact  Extremities: no cyanosis, no edema     Labs/Imaging/Diagnostics   Labs:  CBC:  Recent Labs     08/16/22  1740 08/17/22  0530   WBC 9.8 7.1   RBC 4.47 4.35   HGB 13.7 13.6   HCT 40.9 39.9   MCV 91.5 91.7   RDW 11.9 11.7   PLT 163 174     CHEMISTRIES:  Recent Labs     08/16/22  1740 08/17/22  0530   NA 142 140   K 3.6 4.7   CL 104 101   CO2 25 28   BUN 22 23   CREATININE 0.9 1.0   GLUCOSE 94 154*     PT/INR:No results for input(s): "PROTIME", "INR" in the last 72 hours.  APTT:No results for input(s): "APTT" in the last 72 hours.  LIVER PROFILE:  Recent Labs     08/16/22  1740 08/17/22  0530   AST 27 22   ALT 32 32   BILITOT 0.47 0.55   ALKPHOS 76 75  Imaging Last 24 Hours:  CTA CHEST W WO CONTRAST    Result Date: 08/16/2022  CTA chest: 08/16/22 INDICATION: Worsening shortness of breath, r/o PE COMPARISON: CT 08/16/2022 at 7:27 PM, CT 05/14/2021 TECHNIQUE: PE protocol (Postcontrast imaging from the thoracic inlet through the  hemidiaphragms in the pulmonary arterial phase. Axial 5x5 mm soft tissue and lung 2x2 mm images. Multiplanar 3-D volumetric MIP reconstructions through the pulmonary arteries per protocol.) CT scanning was performed using radiation dose  reduction techniques when appropriate, per system protocols. Pulmonary arteries: No evidence of a pulmonary embolus. Lungs/Airways: Extensive subpleural reticular opacities and centrilobular emphysema. There is traction bronchiectasis in the lung bases. Basilar predominant honeycombing. No focal area of consolidation. Pleura: No pleural effusion or pneumothorax. Lymph Nodes: Multiple enlarged mediastinal lymph nodes, similar to prior. For reference, there is a 13 mm precarinal lymph node. Cardiovascular: Normal heart size. No pericardial effusion. Moderate coronary artery calcifications. Osseous structures/soft tissue: No suspicious lytic or blastic osseous lesions. Unchanged 4.4 cm sebaceous cyst in the  posterior left back. Upper Abdomen: Unremarkable.     No evidence of pulmonary embolus. Stable appearance of pulmonary fibrosis - UIP pattern and emphysema. No focal consolidation.    CT CHEST WO CONTRAST    Result Date: 08/16/2022  CT chest without contrast: 08/16/22 INDICATION:  shortness of breath COMPARISON: CT 05/14/2021 TECHNIQUE: Routine noncontrast protocol (Axial 5 x 5 mm imaging from the thoracic inlet through the hemidiaphragms with soft tissue and lung algorithms. Lung algorithm coronal 3 x 3 mm reconstruction.) CT scanning was performed using radiation dose reduction techniques when appropriate, per system protocols FINDINGS: Lymphadenopathy: Scattered subcentimeter mediastinal nodes are present, but notably smaller than on the comparison 2022 CT. For reference, precarinal 1.5 cm  node previously measured about 1.8 cm. Mediastinum: Otherwise unremarkable. Heart size: Normal heart size. Extensive coronary calcification. Pericardial effusion: Trace pleural fluid. Pleural effusion: None. Pneumothorax: None. Lungs: Advanced centrilobular emphysema. Extensive changes in the lungs typical of UIP again noted. Specifically, there is subpleural basilar predominant honeycombing with intralobular septal thickening and bronchiectasis. Overall findings are stable to slightly progressed from 2022. No acute consolidation. Visualized upper abdomen: Unremarkable. Bones: No concerning osseous lesion. Ovoid sebaceous cyst posterior aspect upper left back adjacent to the left scapula, grossly stable to     1. Extensive pulmonary fibrosis in UIP pattern, stable to slightly progressed from 2022. 2. Emphysema. 3. No acute abnormality.    XR CHEST PORTABLE    Result Date: 08/16/2022  Chest AP: 08/16/22 INDICATION: "Chest Pain". COMPARISON: Chest radiograph July 25, 2022 FINDINGS/    Cardiomediastinal silhouette is unchanged. Chronic lower lobe predominant interstitial airspace opacities appear similar in configuration to prior  examination and compatible with history of pulmonary fibrosis. Superimposed infection would be difficult to exclude given extent. No large pleural effusion.  No pneumothorax. Otherwise, stable exam.        Assessment        Hospital Problems             Last Modified POA    * (Principal) Acute on chronic hypoxic respiratory failure ([place]) 08/17/2022 Yes    Interstitial lung disease ([place]) 08/17/2022 Yes    Pulmonary fibrosis ([place]) 08/17/2022 Yes        Plan:        -Acute on chronic hypoxemic respiratory failure  -Combined severe pulmonary fibrosis with bullous emphysema  -Possible right middle lobe pulmonary infiltrate     -Her CT scan looks fairly similar to the prior study  done several months ago.  There may be slight more increased infiltrate in the right middle lobe, though this is not definite.       -Her symptoms could just be progressive disease over time.  Review of outpatient records seem to indicate that she has struggled with progressive decline over the past year or so.     -Echocardiogram did not show significant RV dysfunction, but RVSP could not be estimated.       -Given the findings on CT possible acute exacerbation.  Recommend empiric treatment for COPD exacerbation and potential early pneumonia right middle lobe with IV steroids followed by a prednisone taper as an outpatient, and a 7-day course of Levaquin for community-acquired pneumonia.  Follow-up cultures and adjust if indicated.  Other lab workup and presentation seems to suggest that infection is less likely, but difficult to completely rule out given her underlying severe lung disease.     -Consider right heart catheterization as an outpatient to see if she is a candidate for pulmonary vasodilators in the future, but this would most likely have limited benefit in her overall disease progression and quality of life.     -If she can have enough recovery to do pulmonary rehab, she could be considered for lung transplant depending on lung transplant  team at [facility] evaluation.     -I discussed with her and her family that her presentation could be the natural progression of her disease over time and not an acute illness.  We discussed hospice options in the future.  She will need to go home with enough supplemental oxygen that she can perform basic functions, may need something where she can get up to 6 -10 L/min nasal cannula for ambulation.  She should probably go home with hospice (home hospice) in place while she is evaluating her options.    I discussed her case with her family and Dr. Ruthell Rummage    Electronically signed by Rosario Jacks, MD on 08/18/22 at 8:27 AM EST

## 2022-08-18 NOTE — Discharge Summary (Signed)
[facility] Hospitalist Service                                                                        DISCHARGE SUMMARY      Date:08/18/2022        Patient Name:Melanie Sandoval     Date of Birth:05-23-1956     Age:67 y.o.    Admit Date:08/16/2022   Admission Condition:stable   Discharged Condition:stable  Discharge Date: 08/18/22     Discharge Diagnoses   Principal Problem:    Acute on chronic hypoxic respiratory failure ([place])  Active Problems:    Interstitial lung disease ([place])    Pulmonary fibrosis (H[facility])  Resolved Problems:    * No resolved hospital problems. [facility]   History of Present Illness  " Melanie Sandoval is a 67 y.o. female with a PMHx significant for pulmonary fibrosis (on 2L NC as outpatient), emphysema, hypothyroidism -- who presented to the ED with complaints of progressive dyspnea, which the patient has been noticing over the past ~1 month. The history is supplemented by the patient's daughter at bedside, who was previously an Therapist, sports within the [employer] system. The patient and her daughter are concerned about the patient's progressive functional decline, given that she lives independently, and her activities are now limited rather significantly by dyspnea. In the past month, she has required an upward titration of O2 to 5L, especially with ambulation. She denies any significant changes in her overall status otherwise; no chest pain, pleurisy, change in cough. No fever, chills, nausea, vomiting. Patient was recently seen by Dr. Rubye Beach (pulmonology), who ordered a TTE. Attempts have been made to initiate patient on nintedanib, though patient was unable to tolerate the medication. She was recently prescribed a course of augmentin and prednisone by her PCP, but unfortunately these therapies offered no benefit to the patient, per report.      On arrival to the ER, the patient was afebrile and hemodynamically stable; saturating appropriately on 3L via NC  at rest. Serum chemistries entirely unremarkable. CBC without leukocytosis or anemia. CTA chest negative for PE, though 'stable appearance of pulmonary fibrosis' noted, in the UIP pattern. No focal consolidation noted. Procalcitonin within normal limits. BNP mildly (and chronically) elevated, at 1650. Patient received 10mg  IV dexamethasone, 40mg  IV lasix, and nebulizer therapies in the ER. Attempt made to ambulate the patient, and she reportedly desaturated into the mid 70's on O2 therapy. Admission requested for further management. 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  Negative for PE.  Severe fibrosis and bullous emphysema.  No clear pulmonary infiltrate but difficult to rule out with degree of fibrosis.     Treating empirically for COPD exacerbation and community-acquired pneumonia  Continue steroid taper  Continue levofloxacin  Discussed with Pulmonology  Discussed with patient and daughter. They are willing to consider hospice for support    Consultants:   IP CONSULT TO PALLIATIVE CARE  IP CONSULT TO PULMONOLOGY  IP CONSULT TO HOSPICE          Surgeries/Procedures Performed:  Significant Diagnostic Studies:     Radiology Last 7 Days:  CTA CHEST W WO CONTRAST    Result Date: 08/16/2022  No evidence of pulmonary embolus. Stable appearance of pulmonary fibrosis - UIP pattern and emphysema. No focal consolidation.    CT CHEST WO CONTRAST    Result Date: 08/16/2022  1. Extensive pulmonary fibrosis in UIP pattern, stable to slightly progressed from 2022. 2. Emphysema. 3. No acute abnormality.    XR CHEST PORTABLE    Result Date: 08/16/2022  Cardiomediastinal silhouette is unchanged. Chronic lower lobe predominant interstitial airspace opacities appear similar in configuration to prior examination and compatible with history of pulmonary fibrosis. Superimposed infection would be difficult to exclude  given extent. No large pleural effusion.  No pneumothorax. Otherwise, stable exam.      Physical exam   Vitals:  BP (!) 141/84   Pulse 75   Temp 97.7 F (36.5 C) (Oral)   Resp 18   Ht 1.575 m (5\' 2" )   Wt 72.8 kg (160 lb 9.6 oz)   SpO2 (!) 89%   BMI 29.37 kg/m   Temp (24hrs), Avg:98 F (36.7 C), Min:97.7 F (36.5 C), Max:98.3 F (36.8 C)      I/O (24Hr):    Intake/Output Summary (Last 24 hours) at 08/18/2022 1225  Last data filed at 08/18/2022 0946  Gross per 24 hour   Intake 1240 ml   Output --   Net 1240 ml       General: Alert and oriented, no acute distress.      Discharge Plan   Disposition: Home    Provider Follow-Up:   Gean Birchwood, [place] [address]  [phone]    Call in 3 day(s)      Gean Birchwood, [place] [address]  [phone]          Eugenie Filler, MD  [address]  [place] [address]  [phone]    Call in 1 week(s)      Eugenie Filler, [place]  [address] [place] [address]  [phone]             It is critical that you make your follow-up appointment(s). If you are discharged on the weekend or after business hours, or if we are unable to schedule these appointments for you for any reason, you or a family member need to call during the next business day to schedule your appointment(s).      Patient Instructions       Activity: activity as tolerated      Discharge Medications         Medication List        START taking these medications      levoFLOXacin 500 MG tablet  Commonly known as: LEVAQUIN  Take 1 tablet by mouth daily for 4 doses  Start taking on: August 19, 2022     LORazepam 1 MG tablet  Commonly known as: ATIVAN  Take 1 tablet by mouth every 8 hours as needed for Anxiety for up to 3 days. Max Daily Amount: 3 mg            CHANGE how you take these medications      hydrOXYzine HCl 25 MG tablet  Commonly known as: ATARAX  Take 1 tablet  by mouth twice daily as needed for itching or anxiety  What changed:   how much to take  how to take this  when to take this  reasons to take this     predniSONE 10 MG tablet  Commonly known as: DELTASONE  Take 4 tablets by mouth daily for 4 days, THEN 2 tablets daily for 4 days, THEN 1 tablet daily for 3 days, THEN 0.5 tablets daily for 2 days.  Start taking on: August 18, 2022  What changed:   See the new instructions.  Another medication with the same name was removed. Continue taking this medication, and follow the directions you see here.            CONTINUE taking these medications      albuterol sulfate HFA 108 (90 Base) MCG/ACT inhaler  Commonly known as: PROVENTIL;VENTOLIN;PROAIR     atorvastatin 10 MG tablet  Commonly known as: LIPITOR  Take 1 tablet by mouth daily     Breztri Aerosphere 160-9-4.8 MCG/ACT Aero  Generic drug: Budeson-Glycopyrrol-Formoterol     diclofenac sodium 1 % Gel  Commonly known as: VOLTAREN  Apply 2 g topically 4 times daily as needed for Pain     ipratropium 0.5 mg-albuterol 2.5 mg 0.5-2.5 (3) MG/3ML Soln nebulizer solution  Commonly known as: DUONEB     levothyroxine 88 MCG tablet  Commonly known as: SYNTHROID  1 tablet daily by mouth     magnesium oxide 400 MG tablet  Commonly known as: MAG-OX     montelukast 10 MG tablet  Commonly known as: Singulair  Take 1 tablet by mouth nightly     Ofev 150 MG Caps  Generic drug: Nintedanib Esylate     omeprazole 20 MG tablet  Commonly known as: PRILOSEC OTC     traZODone 100 MG tablet  Commonly known as: DESYREL  Take 1 tablet by mouth nightly as needed for Sleep     vitamin D 50 MCG (2000 UT) Caps capsule            STOP taking these medications      azithromycin 250 MG tablet  Commonly known as: ZITHROMAX     Magnesium 400 MG Tabs     Tavaborole 5 % Soln               Where to Get Your Medications        These medications were sent to [facility], [place]  [address], [place] [address]      Phone: [phone]   levoFLOXacin 500 MG tablet  LORazepam 1 MG tablet  predniSONE 10 MG tablet           Time Spent on Discharge:  Greater than 30 minutes were spent in patient examination, evaluation, counseling as well as medication reconciliation, prescriptions for required medications, discharge plan and follow up.  Electronically signed by Christie Beckers, MD on 08/18/22 at 12:25 PM EST

## 2022-08-20 ENCOUNTER — Telehealth: Payer: Self-pay

## 2022-08-20 NOTE — Transitions of Care (Post Inpatient/ED Visit) (Signed)
   08/20/2022  Name: Dawn Day MRN: [id number] DOB: 08/14/1955  Today's TOC FU Call Status: Today's TOC FU Call Status:: Successful TOC FU Call Competed TOC FU Call Complete Date: 08/20/22  Transition Care Management Follow-up Telephone Call Date of Discharge: 08/18/22 Discharge Facility: Other Facility Name of Other Discharge Facility: [facility] Type of Discharge: Inpatient Admission Primary Inpatient Discharge Diagnosis:: hypoxemia- pt is  not following Dr Einar Pheasant  Items Reviewed:    [place] and Equipment/Supplies:    Functional Questionnaire:    Folllow up appointments reviewed:      SIGNATURE  Juanda Crumble LPN [place] [facility] D[facility] [phone]

## 2022-09-18 ENCOUNTER — Encounter: Payer: MEDICARE | Attending: Family Medicine | Primary: Family Medicine

## 2022-10-24 ENCOUNTER — Encounter

## 2022-10-24 MED ORDER — HYDROXYZINE HCL 25 MG PO TABS
25 MG | ORAL_TABLET | Freq: Three times a day (TID) | ORAL | 1 refills | Status: AC | PRN
Start: 2022-10-24 — End: ?

## 2023-03-06 NOTE — Telephone Encounter (Signed)
 They are faxing over a plan of care for billing

## 2023-03-18 NOTE — Telephone Encounter (Signed)
 Patient is returning a missed call.

## 2023-03-18 NOTE — Telephone Encounter (Signed)
 Lmovm to call office back.

## 2023-05-27 ENCOUNTER — Encounter: Admit: 2023-05-27 | Admitting: Family Medicine

## 2023-05-27 DIAGNOSIS — J849 Interstitial pulmonary disease, unspecified: Secondary | ICD-10-CM

## 2023-05-27 NOTE — Telephone Encounter (Signed)
 Becky from [facility] making Dr. Baltazar Apo aware pt passed away November 18, 2025at 2:30 am. Please advise

## 2023-06-09 DEATH — deceased
